# Patient Record
Sex: Female | Born: 1980 | Race: White | Hispanic: Yes | Marital: Single | Smoking: Never smoker
Health system: Southern US, Community
[De-identification: ages and names within clinical notes are randomized; demographics above are authoritative.]

## PROBLEM LIST (undated history)

## (undated) HISTORY — PX: CHOLECYSTECTOMY: SHX55

---

## 2019-12-22 ENCOUNTER — Emergency Department (HOSPITAL_COMMUNITY)
Admission: EM | Admit: 2019-12-22 | Discharge: 2019-12-22 | Disposition: A | Discharge status: Home / Self Care | Payer: Medicaid Other | Source: Home / Self Care | Attending: Emergency Medicine | Admitting: Emergency Medicine

## 2019-12-22 ENCOUNTER — Encounter (HOSPITAL_COMMUNITY): Payer: Self-pay | Admitting: Emergency Medicine

## 2019-12-22 ENCOUNTER — Emergency Department (HOSPITAL_COMMUNITY): Payer: Medicaid Other

## 2019-12-22 ENCOUNTER — Other Ambulatory Visit: Payer: Self-pay

## 2019-12-22 DIAGNOSIS — R1084 Generalized abdominal pain: Secondary | ICD-10-CM | POA: Insufficient documentation

## 2019-12-22 DIAGNOSIS — Z20828 Contact with and (suspected) exposure to other viral communicable diseases: Secondary | ICD-10-CM | POA: Insufficient documentation

## 2019-12-22 DIAGNOSIS — R110 Nausea: Secondary | ICD-10-CM

## 2019-12-22 LAB — COMPREHENSIVE METABOLIC PANEL
ALT: 14 U/L (ref 0–44)
AST: 18 U/L (ref 15–41)
Albumin: 4.1 g/dL (ref 3.5–5.0)
Alkaline Phosphatase: 69 U/L (ref 38–126)
Anion gap: 10 (ref 5–15)
BUN: 7 mg/dL (ref 6–20)
CO2: 22 mmol/L (ref 22–32)
Calcium: 9.3 mg/dL (ref 8.9–10.3)
Chloride: 105 mmol/L (ref 98–111)
Creatinine, Ser: 0.7 mg/dL (ref 0.44–1.00)
GFR calc Af Amer: >60 mL/min (ref >60)
GFR calc non Af Amer: >60 mL/min (ref >60)
Glucose, Bld: 131 mg/dL — ABNORMAL HIGH (ref 70–99)
Potassium: 3.7 mmol/L (ref 3.5–5.1)
Sodium: 137 mmol/L (ref 135–145)
Total Bilirubin: 1.1 mg/dL (ref 0.3–1.2)
Total Protein: 7.4 g/dL (ref 6.5–8.1)

## 2019-12-22 LAB — URINALYSIS, ROUTINE W REFLEX MICROSCOPIC
Bilirubin Urine: NEGATIVE
Glucose, UA: NEGATIVE mg/dL
Hgb urine dipstick: NEGATIVE
Ketones, ur: 20 mg/dL — AB
Nitrite: NEGATIVE
Protein, ur: 100 mg/dL — AB
Specific Gravity, Urine: 1.021 (ref 1.005–1.030)
pH: 8 (ref 5.0–8.0)

## 2019-12-22 LAB — CBC
HCT: 36.3 % (ref 36.0–46.0)
Hemoglobin: 11.7 g/dL — ABNORMAL LOW (ref 12.0–15.0)
MCH: 25.3 pg — ABNORMAL LOW (ref 26.0–34.0)
MCHC: 32.2 g/dL (ref 30.0–36.0)
MCV: 78.4 fL — ABNORMAL LOW (ref 80.0–100.0)
Platelets: 274 10*3/uL (ref 150–400)
RBC: 4.63 MIL/uL (ref 3.87–5.11)
RDW: 15.9 % — ABNORMAL HIGH (ref 11.5–15.5)
WBC: 6.7 10*3/uL (ref 4.0–10.5)
nRBC: 0 % (ref 0.0–0.2)

## 2019-12-22 LAB — WET PREP, GENITAL
Clue Cells Wet Prep HPF POC: NONE SEEN
Sperm: NONE SEEN
Trich, Wet Prep: NONE SEEN
Yeast Wet Prep HPF POC: NONE SEEN

## 2019-12-22 LAB — LIPASE, BLOOD: Lipase: 23 U/L (ref 11–51)

## 2019-12-22 LAB — I-STAT BETA HCG BLOOD, ED (MC, WL, AP ONLY): I-stat hCG, quantitative: <5 m[IU]/mL (ref <5)

## 2019-12-22 MED ORDER — KETOROLAC TROMETHAMINE 30 MG/ML IJ SOLN
30.0000 mg | Freq: Once | INTRAMUSCULAR | Status: AC
  Administered 2019-12-22: 09:00:00 30 mg via INTRAVENOUS
  Filled 2019-12-22: qty 1

## 2019-12-22 MED ORDER — SODIUM CHLORIDE 0.9 % IV BOLUS
1000.0000 mL | Freq: Once | INTRAVENOUS | Status: AC
  Administered 2019-12-22: 08:00:00 1000 mL via INTRAVENOUS

## 2019-12-22 MED ORDER — SODIUM CHLORIDE 0.9% FLUSH: 3.0000 mL | Freq: Once | INTRAVENOUS | Status: DC

## 2019-12-22 MED ORDER — MORPHINE SULFATE (PF) 4 MG/ML IV SOLN
4.0000 mg | Freq: Once | INTRAVENOUS | Status: AC
  Administered 2019-12-22: 4 mg via INTRAVENOUS
  Filled 2019-12-22: qty 1

## 2019-12-22 MED ORDER — ONDANSETRON HCL 4 MG/2ML IJ SOLN
4.0000 mg | Freq: Once | INTRAMUSCULAR | Status: AC
  Administered 2019-12-22: 4 mg via INTRAVENOUS
  Filled 2019-12-22: qty 2

## 2019-12-22 MED ORDER — SODIUM CHLORIDE 0.9 % IV BOLUS
1000.0000 mL | Freq: Once | INTRAVENOUS | Status: AC
  Administered 2019-12-22: 1000 mL via INTRAVENOUS

## 2019-12-22 MED ORDER — IOHEXOL 300 MG/ML  SOLN
100.0000 mL | Freq: Once | INTRAMUSCULAR | Status: AC | PRN
  Administered 2019-12-22: 09:00:00 100 mL via INTRAVENOUS

## 2019-12-22 MED ORDER — HYDROMORPHONE HCL 1 MG/ML IJ SOLN
0.5000 mg | Freq: Once | INTRAMUSCULAR | Status: AC
  Administered 2019-12-22: 13:00:00 0.5 mg via INTRAVENOUS
  Filled 2019-12-22: qty 1

## 2019-12-22 MED ORDER — HYDROMORPHONE HCL 1 MG/ML IJ SOLN
0.5000 mg | Freq: Once | INTRAMUSCULAR | Status: AC
  Administered 2019-12-22: 0.5 mg via INTRAVENOUS
  Filled 2019-12-22: qty 1

## 2019-12-22 MED ORDER — ONDANSETRON 4 MG PO TBDP: 4.0000 mg | ORAL_TABLET | Freq: Three times a day (TID) | ORAL | 0 refills | Status: DC | PRN

## 2019-12-22 MED ORDER — HYDROCODONE-ACETAMINOPHEN 5-325 MG PO TABS: 1.0000 | ORAL_TABLET | Freq: Four times a day (QID) | ORAL | 0 refills | Status: DC | PRN

## 2019-12-22 NOTE — ED Notes (Signed)
Using interpreter pt reports pain remains 10/10 after Morphine.  PA contacted.

## 2019-12-22 NOTE — ED Notes (Signed)
continues to have elevated pain responses.  PA in with pt to review CTT results and possible admission.  Pt understands with no further questions per interpreter.

## 2019-12-22 NOTE — Consult Note (Signed)
Reason for Consult: enteritis vs SBO Referring Physician: Elson Clan, [place]  Linda Vang is an 38 y.o. female.   HPI: 38yo Spanish speaking female presents with abdominal pain, n/v since 2300 on 12/24. History and physical exam performed with the assistance of an interpreter with the patient's nurse present in the room. She localizes her abdominal pain centrally and in her left back. She denies any prior symptoms. She last ate 12/24 AM and her last BM was 12/23. She has continued to pass flatus since then. She denies any sick contacts.   History reviewed. No pertinent past medical history.  Past Surgical History:  Procedure Laterality Date  . CESAREAN SECTION    . CHOLECYSTECTOMY      No family history on file.  Social History:  reports that she has never smoked. She has never used smokeless tobacco. She reports that she does not drink alcohol or use drugs.  Allergies: No Known Allergies  Medications: I have reviewed the patient's current medications.  Results for orders placed or performed during the hospital encounter of 12/22/19 (from the past 48 hour(s))  Lipase, blood     Status: None   Collection Time: 12/22/19  5:01 AM  Result Value Ref Range   Lipase 23 11 - 51 U/L    Comment: Performed at [facility], [address] [place], [place] [address]  Comprehensive metabolic panel     Status: Abnormal   Collection Time: 12/22/19  5:01 AM  Result Value Ref Range   Sodium 137 135 - 145 mmol/L   Potassium 3.7 3.5 - 5.1 mmol/L   Chloride 105 98 - 111 mmol/L   CO2 22 22 - 32 mmol/L   Glucose, Bld 131 (H) 70 - 99 mg/dL   BUN 7 6 - 20 mg/dL   Creatinine, Ser 3.55 0.44 - 1.00 mg/dL   Calcium 9.3 8.9 - 73.2 mg/dL   Total Protein 7.4 6.5 - 8.1 g/dL   Albumin 4.1 3.5 - 5.0 g/dL   AST 18 15 - 41 U/L   ALT 14 0 - 44 U/L   Alkaline Phosphatase 69 38 - 126 U/L   Total Bilirubin 1.1 0.3 - 1.2 mg/dL   GFR calc non Af Amer >60 >60 mL/min   GFR calc Af Amer >60 >60  mL/min   Anion gap 10 5 - 15    Comment: Performed at [facility], [address] [place], [place] [address]  CBC     Status: Abnormal   Collection Time: 12/22/19  5:01 AM  Result Value Ref Range   WBC 6.7 4.0 - 10.5 K/uL   RBC 4.63 3.87 - 5.11 MIL/uL   Hemoglobin 11.7 (L) 12.0 - 15.0 g/dL   HCT 27.0 62.3 - 76.2 %   MCV 78.4 (L) 80.0 - 100.0 fL   MCH 25.3 (L) 26.0 - 34.0 pg   MCHC 32.2 30.0 - 36.0 g/dL   RDW 83.1 (H) 51.7 - 61.6 %   Platelets 274 150 - 400 K/uL   nRBC 0.0 0.0 - 0.2 %    Comment: Performed at [facility], [address], [place], [place] [address]  I-Stat beta hCG blood, ED     Status: None   Collection Time: 12/22/19  5:09 AM  Result Value Ref Range   I-stat hCG, quantitative <5.0 <5 mIU/mL   Comment 3            Comment:   GEST. AGE  CONC.  (mIU/mL)   <=1 WEEK        5 - 50     2 WEEKS       50 - 500     3 WEEKS       100 - 10,000     4 WEEKS     1,000 - 30,000        FEMALE AND NON-PREGNANT FEMALE:     LESS THAN 5 mIU/mL   Urinalysis, Routine w reflex microscopic     Status: Abnormal   Collection Time: 12/22/19  7:16 AM  Result Value Ref Range   Color, Urine YELLOW YELLOW   APPearance HAZY (A) CLEAR   Specific Gravity, Urine 1.021 1.005 - 1.030   pH 8.0 5.0 - 8.0   Glucose, UA NEGATIVE NEGATIVE mg/dL   Hgb urine dipstick NEGATIVE NEGATIVE   Bilirubin Urine NEGATIVE NEGATIVE   Ketones, ur 20 (A) NEGATIVE mg/dL   Protein, ur 161100 (A) NEGATIVE mg/dL   Nitrite NEGATIVE NEGATIVE   Leukocytes,Ua TRACE (A) NEGATIVE   RBC / HPF 0-5 0 - 5 RBC/hpf   WBC, UA 0-5 0 - 5 WBC/hpf   Bacteria, UA FEW (A) NONE SEEN   Squamous Epithelial / LPF 11-20 0 - 5   Mucus PRESENT     Comment: Performed at [facility], [address] [place], [place] [address]  Wet prep, genital     Status: Abnormal   Collection Time: 12/22/19  9:35 AM   Specimen: PATH Cytology Cervicovaginal Ancillary Only  Result Value Ref Range   Yeast Wet Prep HPF POC NONE SEEN  NONE SEEN   Trich, Wet Prep NONE SEEN NONE SEEN   Clue Cells Wet Prep HPF POC NONE SEEN NONE SEEN   WBC, Wet Prep HPF POC MANY (A) NONE SEEN   Sperm NONE SEEN     Comment: Performed at [facility], [address] [place], [place] [address]    CT ABDOMEN PELVIS W CONTRAST  Result Date: 12/22/2019 CLINICAL DATA:  Diffuse lower abdominal pain radiating to left side beginning last night. Nausea and vomiting. EXAM: CT ABDOMEN AND PELVIS WITH CONTRAST TECHNIQUE: Multidetector CT imaging of the abdomen and pelvis was performed using the standard protocol following bolus administration of intravenous contrast. CONTRAST:  100mL OMNIPAQUE IOHEXOL 300 MG/ML  SOLN COMPARISON:  None. FINDINGS: Lower chest: Lung bases are normal Hepatobiliary: Previous cholecystectomy. Liver and biliary tree are normal. Pancreas: Normal. Spleen: Normal. Adrenals/Urinary Tract: Adrenal glands are normal. Kidneys are normal in size without hydronephrosis. Bladder is normal. Stomach/Bowel: Stomach is normal. There are a few patchy areas of air and fluid-filled mildly dilated small bowel in the mid to lower abdomen with intervening normal caliber small bowel. There is an adjacent nondilated small bowel loop in the right lower quadrant with wall thickening and mild stranding of the adjacent mesenteric fat. Appendix is normal.  Colon is unremarkable. Vascular/Lymphatic: Abdominal aorta is normal in caliber. No evidence of adenopathy. Reproductive: Within normal. Other: No free peritoneal air. Musculoskeletal: No acute findings. IMPRESSION: A few patchy areas of air and fluid-filled dilated small bowel loops in the mid to lower abdomen in addition to an adjacent thick-walled small bowel loop with stranding of the adjacent mesenteric fat. Findings may be due to a regional enteritis of infectious or inflammatory nature, although patchy areas of ileus or partial obstruction or possible. Electronically Signed   By: Elberta Fortisaniel  Boyle M.D.    On: 12/22/2019 09:41  ROS 10 point review of systems is negative except as listed above in HPI.   Physical Exam Blood pressure 115/76, pulse 73, temperature 98.7 F (37.1 C), temperature source Oral, resp. rate 16, last menstrual period 11/03/2019, SpO2 100 %. Gen: comfortable, no distress Neuro: non-focal exam HEENT: PERRL, MMM Neck: supple CV: RRR Pulm: unlabored breathing Abd: soft, nondistended, NT, even to deep palpation GU: spont voids Extr: wwp, no edema   Assessment/Plan: 63F with abd pain, n/v of 12h duration and abnormal CT imaging.  Patient has normal vitals, normal WBC, and normal electrolytes with a benign abdominal exam. She does not appear to be toxic or dehydrated in any way. Given her surgical history, early pSBO is certainly on the differential, however at this point, I do not think NGT decompression is warranted. After reviewing her CT scan, an infectious/inflammatory etiology of her symptoms is also possible, but if this is indeed the cause, due to her clinical appearance, I would recommend conservative management with bland diet and anti-emetics. I offered the patient the option to be admitted for 23h obs with PO challenge, pain control, and anti-emetics, which she declined in favor of managing her symptoms at home, which I think is quite a reasonable plan. Prior to discharge, she will undergo a PO challenge and a trial of an enteral pain management regimen and if this is successful, she may be discharged home. She states she lives with her mother and three children and can return if her symptoms worsen. She was specifically counseled to return to the ED for worsening abdominal pain or persistent n/v preventing her from maintaining oral intake. She verbalized understanding.    Jesusita Oka, MD General and [facility]

## 2019-12-22 NOTE — ED Notes (Signed)
Remains restless in bed, additional meds given.  PA in with pt as well for update on POC and review of results.

## 2019-12-22 NOTE — ED Provider Notes (Signed)
[facility] EMERGENCY DEPARTMENT Provider Note   CSN: [id number] Arrival date & time: 12/22/19  0434     History Chief Complaint  Patient presents with  . Abdominal Pain    Linda Vang is a 38 y.o. female who presents for evaluation of left sided abdominal pain that began last night. She states that she had some mild abdominal discomfort throughout the day but stated that at approximately 11pm last night, the pain acutely worsened. She states it is in her left side and wraps around to her left flank. She reports associated vomiting. Emesis is non-bloody, non-billious. She has not taken any medication for the pain. Her LMP was 11/03/19. She denies any fevers, CP, SOB, dysuria, hematuria, diarrhea, vaginal bleeding, vaginal discharge.   The history is provided by the patient.       History reviewed. No pertinent past medical history.  There are no problems to display for this patient.   Past Surgical History:  Procedure Laterality Date  . CESAREAN SECTION    . CHOLECYSTECTOMY       OB History   No obstetric history on file.     No family history on file.  Social History   Tobacco Use  . Smoking status: Never Smoker  . Smokeless tobacco: Never Used  Substance Use Topics  . Alcohol use: Never  . Drug use: Never    Home Medications Prior to Admission medications   Medication Sig Start Date End Date Taking? Authorizing Provider  HYDROcodone-acetaminophen (NORCO/VICODIN) 5-325 MG tablet Take 1-2 tablets by mouth every 6 (six) hours as needed. 12/22/19   Maxwell Caul, PA-C  ondansetron (ZOFRAN ODT) 4 MG disintegrating tablet Take 1 tablet (4 mg total) by mouth every 8 (eight) hours as needed for nausea or vomiting. 12/22/19   Maxwell Caul, PA-C    Allergies    Patient has no known allergies.  Review of Systems   Review of Systems  Constitutional: Negative for fever.  Respiratory: Negative for cough and shortness of breath.    Cardiovascular: Negative for chest pain.  Gastrointestinal: Positive for abdominal pain, nausea and vomiting.  Genitourinary: Negative for dysuria, hematuria, vaginal discharge and vaginal pain.  Neurological: Negative for headaches.  All other systems reviewed and are negative.   Physical Exam Updated Vital Signs BP 115/76   Pulse 73   Temp 98.7 F (37.1 C) (Oral)   Resp 16   LMP 11/03/2019   SpO2 100%   Physical Exam Vitals and nursing note reviewed. Exam conducted with a chaperone present.  Constitutional:      Appearance: Normal appearance. She is well-developed.     Comments: Sitting comfortably on examination table  HENT:     Head: Normocephalic and atraumatic.  Eyes:     General: Lids are normal.     Conjunctiva/sclera: Conjunctivae normal.     Pupils: Pupils are equal, round, and reactive to light.  Cardiovascular:     Rate and Rhythm: Normal rate and regular rhythm.     Pulses: Normal pulses.     Heart sounds: Normal heart sounds. No murmur. No friction rub. No gallop.   Pulmonary:     Effort: Pulmonary effort is normal.     Breath sounds: Normal breath sounds.     Comments: Lungs clear to auscultation bilaterally.  Symmetric chest rise.  No wheezing, rales, rhonchi. Abdominal:     Palpations: Abdomen is soft. Abdomen is not rigid.     Tenderness: There is abdominal  tenderness in the left upper quadrant and left lower quadrant. There is left CVA tenderness. There is no guarding.     Comments: Abdomen is soft, non-distended. Tenderness noted to the left abdomen. No rigidity, no guarding. Left sided CVA tenderness noted.   Genitourinary:    Cervix: No cervical motion tenderness.     Adnexa:        Right: No mass or tenderness.         Left: No mass or tenderness.       Comments: The exam was performed with a chaperone present. Normal external female genitalia. No lesions, rash, or sores.  No CMT.  No adnexal mass or tenderness noted bilaterally. Musculoskeletal:         General: Normal range of motion.     Cervical back: Full passive range of motion without pain.  Skin:    General: Skin is warm and dry.     Capillary Refill: Capillary refill takes less than 2 seconds.  Neurological:     Mental Status: She is alert and oriented to person, place, and time.  Psychiatric:        Speech: Speech normal.     ED Results / Procedures / Treatments   Labs (all labs ordered are listed, but only abnormal results are displayed) Labs Reviewed  WET PREP, GENITAL - Abnormal; Notable for the following components:      Result Value   WBC, Wet Prep HPF POC MANY (*)    All other components within normal limits  COMPREHENSIVE METABOLIC PANEL - Abnormal; Notable for the following components:   Glucose, Bld 131 (*)    All other components within normal limits  CBC - Abnormal; Notable for the following components:   Hemoglobin 11.7 (*)    MCV 78.4 (*)    MCH 25.3 (*)    RDW 15.9 (*)    All other components within normal limits  URINALYSIS, ROUTINE W REFLEX MICROSCOPIC - Abnormal; Notable for the following components:   APPearance HAZY (*)    Ketones, ur 20 (*)    Protein, ur 100 (*)    Leukocytes,Ua TRACE (*)    Bacteria, UA FEW (*)    All other components within normal limits  SARS CORONAVIRUS 2 (TAT 6-24 HRS)  LIPASE, BLOOD  I-STAT BETA HCG BLOOD, ED ([facility], [facility], [facility] ONLY)  GC/CHLAMYDIA PROBE AMP ([place]) NOT AT [facility]    EKG None  Radiology CT ABDOMEN PELVIS W CONTRAST  Result Date: 12/22/2019 CLINICAL DATA:  Diffuse lower abdominal pain radiating to left side beginning last night. Nausea and vomiting. EXAM: CT ABDOMEN AND PELVIS WITH CONTRAST TECHNIQUE: Multidetector CT imaging of the abdomen and pelvis was performed using the standard protocol following bolus administration of intravenous contrast. CONTRAST:  OMNIPAQUE IOHEXOL 300 MG/ML  SOLN COMPARISON:  None. FINDINGS: Lower chest: Lung bases are normal Hepatobiliary: Previous  cholecystectomy. Liver and biliary tree are normal. Pancreas: Normal. Spleen: Normal. Adrenals/Urinary Tract: Adrenal glands are normal. Kidneys are normal in size without hydronephrosis. Bladder is normal. Stomach/Bowel: Stomach is normal. There are a few patchy areas of air and fluid-filled mildly dilated small bowel in the mid to lower abdomen with intervening normal caliber small bowel. There is an adjacent nondilated small bowel loop in the right lower quadrant with wall thickening and mild stranding of the adjacent mesenteric fat. Appendix is normal.  Colon is unremarkable. Vascular/Lymphatic: Abdominal aorta is normal in caliber. No evidence of adenopathy. Reproductive: Within normal. Other: No free  peritoneal air. Musculoskeletal: No acute findings. IMPRESSION: A few patchy areas of air and fluid-filled dilated small bowel loops in the mid to lower abdomen in addition to an adjacent thick-walled small bowel loop with stranding of the adjacent mesenteric fat. Findings may be due to a regional enteritis of infectious or inflammatory nature, although patchy areas of ileus or partial obstruction or possible. Electronically Signed   By: Marin Olp M.D.   On: 12/22/2019 09:41    Procedures Procedures (including critical care time)  Medications Ordered in ED Medications  sodium chloride flush (NS) 0.9 % injection 3 mL (3 mLs Intravenous Not Given 12/22/19 0745)  sodium chloride 0.9 % bolus 1,000 mL (0 mLs Intravenous Stopped 12/22/19 0835)  ondansetron (ZOFRAN) injection 4 mg (4 mg Intravenous Given 12/22/19 0744)  morphine 4 MG/ML injection 4 mg (4 mg Intravenous Given 12/22/19 0829)  ketorolac (TORADOL) 30 MG/ML injection 30 mg (30 mg Intravenous Given 12/22/19 0845)  iohexol (OMNIPAQUE) 300 MG/ML solution 100 mL (100 mLs Intravenous Contrast Given 12/22/19 0908)  HYDROmorphone (DILAUDID) injection 0.5 mg (0.5 mg Intravenous Given 12/22/19 1010)  sodium chloride 0.9 % bolus 1,000 mL (1,000 mLs  Intravenous New Bag/Given 12/22/19 1010)  HYDROmorphone (DILAUDID) injection 0.5 mg (0.5 mg Intravenous Given 12/22/19 1248)    ED Course  I have reviewed the triage vital signs and the nursing notes.  Pertinent labs & imaging results that were available during my care of the patient were reviewed by me and considered in my medical decision making (see chart for details).    MDM Rules/Calculators/A&P                      38 year old female who presents for evaluation of left-sided abdominal pain that began yesterday.  Reports associated vomiting.  No fevers, diarrhea. Patient is afebrile, non-toxic appearing, sitting comfortably on examination table. Vital signs reviewed and stable.  On exam, she does have some left-sided CVA tenderness as well as some left abdominal tenderness.  Consider infectious etiology versus GU etiology.  Plan for labs, urine.  I-stat beta is negative. CBC shows no leukocytosis. Hgb is 11.7. CMP shows normal BUN and Cr.  UA shows trace leukocytes, bacteria but there is squamous epithelium present so question contaminant.  Will plan for CT for further evaluation of her pain.  Patient still very uncomfortable after initial round of pain medication.  Will plan for additional analgesics.  Pelvic exam as document above.  No CMT that would be concerning for PID.  No adnexal mass or tenderness noted.  Exam not concerning for ovarian torsion.  Plan to check CT scan.  CT scan shows a few patchy areas of air and fluid-filled dilated small bowel loops in the mid to lower abdomen in addition to an adjacent thick-walled small bowel loop with stranding to the adjacent mesenteric fat.  This may represent enteritis.  Could be ileus or partial obstruction.  Discussed results with patient.  She states she has not had a bowel movement since about Wednesday.  She does think that she is still been passing gas.  She is still very uncomfortable.  Discussed patient with Jerene Pitch (Gen Surg PA).  She will evaluate patient in the ED.   Discussed patient with Dr. Bobbye Morton (Gen Surg) after evaluation with patient. Patient is much more comfortable after pain medications. Given overall reassuring appearance this may be more reflective of infectious etiology rather than obstruction. Patient encouraged on bowel rest. Dr. Bobbye Morton discussed admission vs discharge  home with patient. Patient would like to go home. Plan to PO challenge in the department and reassess.   After p.o. challenge, patient had some returning of pain.  Patient did not have any vomiting.  Initially, patient had requested admission for pain control.  About 20 minutes later, patient wanted to talk to me.  Using interpreter, discussed with her.  She reports feeling better.  She looks better and is resting comfortably in the bed.  She states that she changed her mind and does not wish to be admitted she wishes to go home.  Reevaluation of her abdomen shows improvement in pain.  She is hemodynamically stable.  At this time, I feel that this is reasonable.  We will send her on a short course of pain medication as well as nausea medication. At this time, patient exhibits no emergent life-threatening condition that require further evaluation in ED or admission. Patient had ample opportunity for questions and discussion. All patient's questions were answered with full understanding. Strict return precautions discussed. Patient expresses understanding and agreement to plan.   Portions of this note were generated with Scientist, clinical (histocompatibility and immunogenetics)Dragon dictation software. Dictation errors may occur despite best attempts at proofreading.   Final Clinical Impression(s) / ED Diagnoses Final diagnoses:  Generalized abdominal pain  Nausea    Rx / DC Orders ED Discharge Orders         Ordered    HYDROcodone-acetaminophen (NORCO/VICODIN) 5-325 MG tablet  Every 6 hours PRN     12/22/19 1239    ondansetron (ZOFRAN ODT) 4 MG disintegrating tablet  Every 8 hours PRN      12/22/19 1239           Rosana HoesLayden, Duval Macleod A, PA-C 12/22/19 1251    Loren RacerYelverton, David, MD 12/22/19 1357

## 2019-12-22 NOTE — ED Notes (Signed)
Pt has decided to go home.  DC instructions reviewed with interpreter.  No questions at this time.

## 2019-12-22 NOTE — ED Notes (Signed)
Surgeon in with patient and will do PO fluid challenge and send pt home.  After challenge pt states her pain increased to a 5 and requests to stay in the hospital for pain control.  PA notified.

## 2019-12-22 NOTE — ED Triage Notes (Signed)
Patient reports mid abdominal pain radiating to left flank onset last night with emesis , denies fever or diarrhea . Spanish interpreter services utilized during triage .

## 2019-12-22 NOTE — Discharge Instructions (Signed)
Take pain medications as directed for break through pain. Do not drive or operate machinery while taking this medication.   Take zofran as needed for nausea/vomiting.   Closely monitor your symptoms.  Return emergency department for any worsening abdominal pain, vomiting, inability eat or drink, fevers, chest pain or any other worsening or concerning symptoms.

## 2019-12-23 ENCOUNTER — Inpatient Hospital Stay (HOSPITAL_COMMUNITY): Payer: Medicaid Other

## 2019-12-23 ENCOUNTER — Inpatient Hospital Stay (HOSPITAL_COMMUNITY)
Admission: EM | Admit: 2019-12-23 | Discharge: 2020-01-03 | DRG: 331 | Disposition: A | Discharge status: Home / Self Care | Payer: Medicaid Other | Attending: Surgery | Admitting: Surgery

## 2019-12-23 ENCOUNTER — Other Ambulatory Visit: Payer: Self-pay

## 2019-12-23 ENCOUNTER — Emergency Department (HOSPITAL_COMMUNITY): Payer: Medicaid Other

## 2019-12-23 ENCOUNTER — Encounter (HOSPITAL_COMMUNITY): Payer: Self-pay | Admitting: Emergency Medicine

## 2019-12-23 DIAGNOSIS — E876 Hypokalemia: Secondary | ICD-10-CM | POA: Diagnosis not present

## 2019-12-23 DIAGNOSIS — K56600 Partial intestinal obstruction, unspecified as to cause: Secondary | ICD-10-CM

## 2019-12-23 DIAGNOSIS — Z20822 Contact with and (suspected) exposure to covid-19: Secondary | ICD-10-CM | POA: Diagnosis present

## 2019-12-23 DIAGNOSIS — K56609 Unspecified intestinal obstruction, unspecified as to partial versus complete obstruction: Secondary | ICD-10-CM | POA: Diagnosis present

## 2019-12-23 DIAGNOSIS — R112 Nausea with vomiting, unspecified: Secondary | ICD-10-CM

## 2019-12-23 DIAGNOSIS — Z0189 Encounter for other specified special examinations: Secondary | ICD-10-CM

## 2019-12-23 DIAGNOSIS — K5651 Intestinal adhesions [bands], with partial obstruction: Principal | ICD-10-CM | POA: Diagnosis present

## 2019-12-23 LAB — CBC WITH DIFFERENTIAL/PLATELET
Abs Immature Granulocytes: 0.03 10*3/uL (ref 0.00–0.07)
Basophils Absolute: 0 10*3/uL (ref 0.0–0.1)
Basophils Relative: 0 %
Eosinophils Absolute: 0 10*3/uL (ref 0.0–0.5)
Eosinophils Relative: 0 %
HCT: 38.6 % (ref 36.0–46.0)
Hemoglobin: 12.2 g/dL (ref 12.0–15.0)
Immature Granulocytes: 0 %
Lymphocytes Relative: 9 %
Lymphs Abs: 0.8 10*3/uL (ref 0.7–4.0)
MCH: 25.1 pg — ABNORMAL LOW (ref 26.0–34.0)
MCHC: 31.6 g/dL (ref 30.0–36.0)
MCV: 79.3 fL — ABNORMAL LOW (ref 80.0–100.0)
Monocytes Absolute: 0.4 10*3/uL (ref 0.1–1.0)
Monocytes Relative: 4 %
Neutro Abs: 8.4 10*3/uL — ABNORMAL HIGH (ref 1.7–7.7)
Neutrophils Relative %: 87 %
Platelets: 263 10*3/uL (ref 150–400)
RBC: 4.87 MIL/uL (ref 3.87–5.11)
RDW: 16.5 % — ABNORMAL HIGH (ref 11.5–15.5)
WBC: 9.7 10*3/uL (ref 4.0–10.5)
nRBC: 0 % (ref 0.0–0.2)

## 2019-12-23 LAB — COMPREHENSIVE METABOLIC PANEL
ALT: 14 U/L (ref 0–44)
AST: 18 U/L (ref 15–41)
Albumin: 4 g/dL (ref 3.5–5.0)
Alkaline Phosphatase: 67 U/L (ref 38–126)
Anion gap: 10 (ref 5–15)
BUN: 8 mg/dL (ref 6–20)
CO2: 22 mmol/L (ref 22–32)
Calcium: 9.2 mg/dL (ref 8.9–10.3)
Chloride: 106 mmol/L (ref 98–111)
Creatinine, Ser: 0.68 mg/dL (ref 0.44–1.00)
GFR calc Af Amer: >60 mL/min (ref >60)
GFR calc non Af Amer: >60 mL/min (ref >60)
Glucose, Bld: 140 mg/dL — ABNORMAL HIGH (ref 70–99)
Potassium: 3.3 mmol/L — ABNORMAL LOW (ref 3.5–5.1)
Sodium: 138 mmol/L (ref 135–145)
Total Bilirubin: 1.2 mg/dL (ref 0.3–1.2)
Total Protein: 7.5 g/dL (ref 6.5–8.1)

## 2019-12-23 LAB — LIPASE, BLOOD: Lipase: 24 U/L (ref 11–51)

## 2019-12-23 MED ORDER — HYDROMORPHONE HCL 1 MG/ML IJ SOLN
0.5000 mg | INTRAMUSCULAR | Status: DC | PRN
  Administered 2019-12-24 – 2019-12-28 (×16): 0.5 mg via INTRAVENOUS
  Filled 2019-12-23 (×17): qty 1

## 2019-12-23 MED ORDER — HEPARIN SODIUM (PORCINE) 5000 UNIT/ML IJ SOLN
5000.0000 [IU] | Freq: Three times a day (TID) | INTRAMUSCULAR | Status: DC
  Administered 2019-12-24 (×3): 5000 [IU] via SUBCUTANEOUS
  Filled 2019-12-23 (×3): qty 1

## 2019-12-23 MED ORDER — ONDANSETRON HCL 4 MG/2ML IJ SOLN
4.0000 mg | Freq: Four times a day (QID) | INTRAMUSCULAR | Status: DC | PRN
  Administered 2019-12-25 – 2019-12-30 (×3): 4 mg via INTRAVENOUS
  Filled 2019-12-23 (×4): qty 2

## 2019-12-23 MED ORDER — LACTATED RINGERS IV SOLN: INTRAVENOUS | Status: DC

## 2019-12-23 MED ORDER — DIATRIZOATE MEGLUMINE & SODIUM 66-10 % PO SOLN
90.0000 mL | Freq: Once | ORAL | Status: AC
  Administered 2019-12-24: 04:00:00 90 mL via NASOGASTRIC
  Filled 2019-12-23: qty 90

## 2019-12-23 MED ORDER — METHOCARBAMOL 500 MG PO TABS
500.0000 mg | ORAL_TABLET | Freq: Four times a day (QID) | ORAL | Status: DC | PRN
  Administered 2019-12-25 – 2020-01-01 (×4): 500 mg via ORAL
  Filled 2019-12-23 (×4): qty 1

## 2019-12-23 MED ORDER — DIPHENHYDRAMINE HCL 50 MG/ML IJ SOLN: 12.5000 mg | Freq: Four times a day (QID) | INTRAMUSCULAR | Status: DC | PRN

## 2019-12-23 MED ORDER — SIMETHICONE 80 MG PO CHEW
40.0000 mg | CHEWABLE_TABLET | Freq: Four times a day (QID) | ORAL | Status: DC | PRN
  Administered 2019-12-27 – 2020-01-02 (×4): 40 mg via ORAL
  Filled 2019-12-23 (×4): qty 1

## 2019-12-23 MED ORDER — DIPHENHYDRAMINE HCL 12.5 MG/5ML PO ELIX: 12.5000 mg | ORAL_SOLUTION | Freq: Four times a day (QID) | ORAL | Status: DC | PRN

## 2019-12-23 MED ORDER — ONDANSETRON HCL 4 MG/2ML IJ SOLN
4.0000 mg | Freq: Once | INTRAMUSCULAR | Status: AC
  Administered 2019-12-23: 4 mg via INTRAVENOUS
  Filled 2019-12-23: qty 2

## 2019-12-23 MED ORDER — IOHEXOL 300 MG/ML  SOLN
100.0000 mL | Freq: Once | INTRAMUSCULAR | Status: AC | PRN
  Administered 2019-12-23: 19:00:00 100 mL via INTRAVENOUS

## 2019-12-23 MED ORDER — ACETAMINOPHEN 500 MG PO TABS
1000.0000 mg | ORAL_TABLET | Freq: Four times a day (QID) | ORAL | Status: DC
  Administered 2019-12-24 – 2020-01-03 (×34): 1000 mg via ORAL
  Filled 2019-12-23 (×36): qty 2

## 2019-12-23 MED ORDER — MORPHINE SULFATE (PF) 4 MG/ML IV SOLN
4.0000 mg | Freq: Once | INTRAVENOUS | Status: AC
  Administered 2019-12-23: 4 mg via INTRAVENOUS
  Filled 2019-12-23: qty 1

## 2019-12-23 MED ORDER — ONDANSETRON 4 MG PO TBDP
4.0000 mg | ORAL_TABLET | Freq: Four times a day (QID) | ORAL | Status: DC | PRN
  Administered 2019-12-27 (×2): 4 mg via ORAL
  Filled 2019-12-23 (×2): qty 1

## 2019-12-23 NOTE — ED Notes (Signed)
RN attempted report 

## 2019-12-23 NOTE — ED Notes (Signed)
Patient daughter Belenda Cruise calling stating she is very worried would like an update on patient Please call   [phone]

## 2019-12-23 NOTE — ED Notes (Signed)
Patient daughter Katherine calling stating she is very worried would like an update on patient Please call   [phone]  

## 2019-12-23 NOTE — ED Notes (Signed)
[phone] caterin cordero call for an update

## 2019-12-23 NOTE — H&P (Signed)
CC: pSBO  Requesting provider: Dietrich Pates PA-C  HPI: Linda Vang is an 38 y.o. female with 2d hx of progressive abdominal cramps - she was here yesterday for this and seen by our service. She was discharged after passing a PO challenge but hasn't felt well since arriving home. She has intermittent nausea since going home and intermittent cramps. Denies fever/chills. Some flatus; last BM was 12/23.   History reviewed. No pertinent past medical history.  Past Surgical History:  Procedure Laterality Date   CESAREAN SECTION     CHOLECYSTECTOMY      No family history on file.  Social:  reports that she has never smoked. She has never used smokeless tobacco. She reports that she does not drink alcohol or use drugs.  Allergies: No Known Allergies  Medications: I have reviewed the patient's current medications.  Results for orders placed or performed during the hospital encounter of 12/23/19 (from the past 48 hour(s))  Comprehensive metabolic panel     Status: Abnormal   Collection Time: 12/23/19  5:42 PM  Result Value Ref Range   Sodium 138 135 - 145 mmol/L   Potassium 3.3 (L) 3.5 - 5.1 mmol/L   Chloride 106 98 - 111 mmol/L   CO2 22 22 - 32 mmol/L   Glucose, Bld 140 (H) 70 - 99 mg/dL   BUN 8 6 - 20 mg/dL   Creatinine, Ser 1.61 0.44 - 1.00 mg/dL   Calcium 9.2 8.9 - 09.6 mg/dL   Total Protein 7.5 6.5 - 8.1 g/dL   Albumin 4.0 3.5 - 5.0 g/dL   AST 18 15 - 41 U/L   ALT 14 0 - 44 U/L   Alkaline Phosphatase 67 38 - 126 U/L   Total Bilirubin 1.2 0.3 - 1.2 mg/dL   GFR calc non Af Amer >60 >60 mL/min   GFR calc Af Amer >60 >60 mL/min   Anion gap 10 5 - 15    Comment: Performed at [facility], [address] [place], [place] [address]  CBC with Differential     Status: Abnormal   Collection Time: 12/23/19  5:42 PM  Result Value Ref Range   WBC 9.7 4.0 - 10.5 K/uL   RBC 4.87 3.87 - 5.11 MIL/uL   Hemoglobin 12.2 12.0 - 15.0 g/dL   HCT 98.1 19.1 - 47.8 %   MCV  79.3 (L) 80.0 - 100.0 fL   MCH 25.1 (L) 26.0 - 34.0 pg   MCHC 31.6 30.0 - 36.0 g/dL   RDW 29.5 (H) 62.1 - 30.8 %   Platelets 263 150 - 400 K/uL   nRBC 0.0 0.0 - 0.2 %   Neutrophils Relative % 87 %   Neutro Abs 8.4 (H) 1.7 - 7.7 K/uL   Lymphocytes Relative 9 %   Lymphs Abs 0.8 0.7 - 4.0 K/uL   Monocytes Relative 4 %   Monocytes Absolute 0.4 0.1 - 1.0 K/uL   Eosinophils Relative 0 %   Eosinophils Absolute 0.0 0.0 - 0.5 K/uL   Basophils Relative 0 %   Basophils Absolute 0.0 0.0 - 0.1 K/uL   Immature Granulocytes 0 %   Abs Immature Granulocytes 0.03 0.00 - 0.07 K/uL    Comment: Performed at [facility], [address], [place], [place] [address]  Lipase, blood     Status: None   Collection Time: 12/23/19  5:42 PM  Result Value Ref Range   Lipase 24 11 - 51 U/L  Comment: Performed at [facility], [address] [place], [place] [id number]    CT ABDOMEN PELVIS W CONTRAST  Result Date: 12/23/2019 CLINICAL DATA:  Abdominal pain and vomiting which has worsened since yesterday, question bowel obstruction EXAM: CT ABDOMEN AND PELVIS WITH CONTRAST TECHNIQUE: Multidetector CT imaging of the abdomen and pelvis was performed using the standard protocol following bolus administration of intravenous contrast. Sagittal and coronal MPR images reconstructed from axial data set. CONTRAST:  100mL OMNIPAQUE IOHEXOL 300 MG/ML SOLN IV. No oral contrast. COMPARISON:  12/22/2019 FINDINGS: Lower chest: Lung bases clear Hepatobiliary: Gallbladder surgically absent. Liver normal appearance. Pancreas: Normal appearance Spleen: Normal appearance Adrenals/Urinary Tract: Adrenal glands, kidneys, ureters, and bladder normal appearance Stomach/Bowel: Small amount of fluid within distal esophagus. Stomach unremarkable. Normal appendix. Colon decompressed. Dilated proximal and mid small bowel loops increased in diameter and number since prior study. Decompressed distal small bowel loops. Findings consistent  with progressive small bowel obstruction. Transition zone appears to be in the RIGHT upper pelvis image 61, question adhesion. Associated mesenteric edema. Thickened small bowel loop seen on the previous exam does not appear thickened on current study. Vascular/Lymphatic: Vascular structures patent.  No adenopathy. Reproductive: Unremarkable uterus and adnexa Other: Low-attenuation free fluid in pelvis. No free air. Umbilical hernia containing fat. Musculoskeletal: No acute osseous findings. IMPRESSION: Progressive dilatation of proximal to mid small bowel loops with persistent decompression of distal small bowel consistent with progressive small bowel obstruction. Transition zone is located in the anterior upper RIGHT pelvis, favor due to adhesion. Increased mesenteric edema of some of the small bowel loops and increased free pelvic fluid without evidence of perforation. Electronically Signed   By: Ulyses SouthwardMark  Boles M.D.   On: 12/23/2019 19:24   CT ABDOMEN PELVIS W CONTRAST  Result Date: 12/22/2019 CLINICAL DATA:  Diffuse lower abdominal pain radiating to left side beginning last night. Nausea and vomiting. EXAM: CT ABDOMEN AND PELVIS WITH CONTRAST TECHNIQUE: Multidetector CT imaging of the abdomen and pelvis was performed using the standard protocol following bolus administration of intravenous contrast. CONTRAST:  100mL OMNIPAQUE IOHEXOL 300 MG/ML  SOLN COMPARISON:  None. FINDINGS: Lower chest: Lung bases are normal Hepatobiliary: Previous cholecystectomy. Liver and biliary tree are normal. Pancreas: Normal. Spleen: Normal. Adrenals/Urinary Tract: Adrenal glands are normal. Kidneys are normal in size without hydronephrosis. Bladder is normal. Stomach/Bowel: Stomach is normal. There are a few patchy areas of air and fluid-filled mildly dilated small bowel in the mid to lower abdomen with intervening normal caliber small bowel. There is an adjacent nondilated small bowel loop in the right lower quadrant with wall  thickening and mild stranding of the adjacent mesenteric fat. Appendix is normal.  Colon is unremarkable. Vascular/Lymphatic: Abdominal aorta is normal in caliber. No evidence of adenopathy. Reproductive: Within normal. Other: No free peritoneal air. Musculoskeletal: No acute findings. IMPRESSION: A few patchy areas of air and fluid-filled dilated small bowel loops in the mid to lower abdomen in addition to an adjacent thick-walled small bowel loop with stranding of the adjacent mesenteric fat. Findings may be due to a regional enteritis of infectious or inflammatory nature, although patchy areas of ileus or partial obstruction or possible. Electronically Signed   By: Elberta Fortisaniel  Boyle M.D.   On: 12/22/2019 09:41    ROS - all of the below systems have been reviewed with the patient and positives are indicated with bold text General: chills, fever or night sweats Eyes: blurry vision or double vision ENT: epistaxis or sore throat Allergy/Immunology:  itchy/watery eyes or nasal congestion Hematologic/Lymphatic: bleeding problems, blood clots or swollen lymph nodes Endocrine: temperature intolerance or unexpected weight changes Breast: new or changing breast lumps or nipple discharge Resp: cough, shortness of breath, or wheezing CV: chest pain or dyspnea on exertion GI: as per HPI GU: dysuria, trouble voiding, or hematuria MSK: joint pain or joint stiffness Neuro: TIA or stroke symptoms Derm: pruritus and skin lesion changes Psych: anxiety and depression  PE Blood pressure 124/87, pulse 74, temperature 97.9 F (36.6 C), temperature source Oral, resp. rate 10, SpO2 98 %. Constitutional: NAD; conversant; no deformities Eyes: Moist conjunctiva; no lid lag; anicteric; pupils equal and round Neck: Trachea midline; no thyromegaly Lungs: Normal respiratory effort; no tactile fremitus CV: RRR; no palpable thrills; no pitting edema GI: Abd soft, nontender, nondistended; no rebound, no guarding; no palpable  hepatosplenomegaly MSK: Normal gait; no clubbing/cyanosis Psychiatric: Appropriate affect; alert and oriented x3 Lymphatic: No palpable cervical or axillary lymphadenopathy  Results for orders placed or performed during the hospital encounter of 12/23/19 (from the past 48 hour(s))  Comprehensive metabolic panel     Status: Abnormal   Collection Time: 12/23/19  5:42 PM  Result Value Ref Range   Sodium 138 135 - 145 mmol/L   Potassium 3.3 (L) 3.5 - 5.1 mmol/L   Chloride 106 98 - 111 mmol/L   CO2 22 22 - 32 mmol/L   Glucose, Bld 140 (H) 70 - 99 mg/dL   BUN 8 6 - 20 mg/dL   Creatinine, Ser 1.61 0.44 - 1.00 mg/dL   Calcium 9.2 8.9 - 09.6 mg/dL   Total Protein 7.5 6.5 - 8.1 g/dL   Albumin 4.0 3.5 - 5.0 g/dL   AST 18 15 - 41 U/L   ALT 14 0 - 44 U/L   Alkaline Phosphatase 67 38 - 126 U/L   Total Bilirubin 1.2 0.3 - 1.2 mg/dL   GFR calc non Af Amer >60 >60 mL/min   GFR calc Af Amer >60 >60 mL/min   Anion gap 10 5 - 15    Comment: Performed at [facility], [address] [place], [place] [address]  CBC with Differential     Status: Abnormal   Collection Time: 12/23/19  5:42 PM  Result Value Ref Range   WBC 9.7 4.0 - 10.5 K/uL   RBC 4.87 3.87 - 5.11 MIL/uL   Hemoglobin 12.2 12.0 - 15.0 g/dL   HCT 98.1 19.1 - 47.8 %   MCV 79.3 (L) 80.0 - 100.0 fL   MCH 25.1 (L) 26.0 - 34.0 pg   MCHC 31.6 30.0 - 36.0 g/dL   RDW 29.5 (H) 62.1 - 30.8 %   Platelets 263 150 - 400 K/uL   nRBC 0.0 0.0 - 0.2 %   Neutrophils Relative % 87 %   Neutro Abs 8.4 (H) 1.7 - 7.7 K/uL   Lymphocytes Relative 9 %   Lymphs Abs 0.8 0.7 - 4.0 K/uL   Monocytes Relative 4 %   Monocytes Absolute 0.4 0.1 - 1.0 K/uL   Eosinophils Relative 0 %   Eosinophils Absolute 0.0 0.0 - 0.5 K/uL   Basophils Relative 0 %   Basophils Absolute 0.0 0.0 - 0.1 K/uL   Immature Granulocytes 0 %   Abs Immature Granulocytes 0.03 0.00 - 0.07 K/uL    Comment: Performed at [facility], [address] [place], [place] [address]    Lipase, blood     Status: None   Collection Time: 12/23/19  5:42 PM  Result Value Ref Range   Lipase 24 11 - 51 U/L    Comment: Performed at [facility], [place] [place], [place] [address]    CT ABDOMEN PELVIS W CONTRAST  Result Date: 12/23/2019 CLINICAL DATA:  Abdominal pain and vomiting which has worsened since yesterday, question bowel obstruction EXAM: CT ABDOMEN AND PELVIS WITH CONTRAST TECHNIQUE: Multidetector CT imaging of the abdomen and pelvis was performed using the standard protocol following bolus administration of intravenous contrast. Sagittal and coronal MPR images reconstructed from axial data set. CONTRAST:  117mL OMNIPAQUE IOHEXOL 300 MG/ML SOLN IV. No oral contrast. COMPARISON:  12/22/2019 FINDINGS: Lower chest: Lung bases clear Hepatobiliary: Gallbladder surgically absent. Liver normal appearance. Pancreas: Normal appearance Spleen: Normal appearance Adrenals/Urinary Tract: Adrenal glands, kidneys, ureters, and bladder normal appearance Stomach/Bowel: Small amount of fluid within distal esophagus. Stomach unremarkable. Normal appendix. Colon decompressed. Dilated proximal and mid small bowel loops increased in diameter and number since prior study. Decompressed distal small bowel loops. Findings consistent with progressive small bowel obstruction. Transition zone appears to be in the RIGHT upper pelvis image 61, question adhesion. Associated mesenteric edema. Thickened small bowel loop seen on the previous exam does not appear thickened on current study. Vascular/Lymphatic: Vascular structures patent.  No adenopathy. Reproductive: Unremarkable uterus and adnexa Other: Low-attenuation free fluid in pelvis. No free air. Umbilical hernia containing fat. Musculoskeletal: No acute osseous findings. IMPRESSION: Progressive dilatation of proximal to mid small bowel loops with persistent decompression of distal small bowel consistent with progressive small bowel obstruction.  Transition zone is located in the anterior upper RIGHT pelvis, favor due to adhesion. Increased mesenteric edema of some of the small bowel loops and increased free pelvic fluid without evidence of perforation. Electronically Signed   By: Lavonia Dana M.D.   On: 12/23/2019 19:24   CT ABDOMEN PELVIS W CONTRAST  Result Date: 12/22/2019 CLINICAL DATA:  Diffuse lower abdominal pain radiating to left side beginning last night. Nausea and vomiting. EXAM: CT ABDOMEN AND PELVIS WITH CONTRAST TECHNIQUE: Multidetector CT imaging of the abdomen and pelvis was performed using the standard protocol following bolus administration of intravenous contrast. CONTRAST:  160mL OMNIPAQUE IOHEXOL 300 MG/ML  SOLN COMPARISON:  None. FINDINGS: Lower chest: Lung bases are normal Hepatobiliary: Previous cholecystectomy. Liver and biliary tree are normal. Pancreas: Normal. Spleen: Normal. Adrenals/Urinary Tract: Adrenal glands are normal. Kidneys are normal in size without hydronephrosis. Bladder is normal. Stomach/Bowel: Stomach is normal. There are a few patchy areas of air and fluid-filled mildly dilated small bowel in the mid to lower abdomen with intervening normal caliber small bowel. There is an adjacent nondilated small bowel loop in the right lower quadrant with wall thickening and mild stranding of the adjacent mesenteric fat. Appendix is normal.  Colon is unremarkable. Vascular/Lymphatic: Abdominal aorta is normal in caliber. No evidence of adenopathy. Reproductive: Within normal. Other: No free peritoneal air. Musculoskeletal: No acute findings. IMPRESSION: A few patchy areas of air and fluid-filled dilated small bowel loops in the mid to lower abdomen in addition to an adjacent thick-walled small bowel loop with stranding of the adjacent mesenteric fat. Findings may be due to a regional enteritis of infectious or inflammatory nature, although patchy areas of ileus or partial obstruction or possible. Electronically Signed   By:  Marin Olp M.D.   On: 12/22/2019 09:41   A/P: Linda Vang is an 38 y.o. female with likely pSBO vs enteritis - etiology clinically looking more like enteritis  as her exam is very benign and soft  -AF, normal vitals, normal wbc -Given radiographic concerns and her being here twice now in 24hrs, will plan for admission and SBO protocol -I discussed imaging findings and overall plan of care moving forward. We will pla to take things a day at a time - we did discuss if she ended up having some sort of obstructive process that did not improve or worsened, scenarios where surgery is necessary. -Accompanied with aid of translator as well  Stephanie Coup. Cliffton Asters, M.D. [facility] Use [email or web address] to contact on call provider

## 2019-12-23 NOTE — ED Provider Notes (Signed)
[facility] EMERGENCY DEPARTMENT Provider Note   CSN: [id number] Arrival date & time: 12/23/19  1333     History Chief Complaint  Patient presents with  . Abdominal Pain    Linda Vang is a 38 y.o. female with prior cholecystectomy and C-section presenting to the ED for ongoing generalized abdominal pain, vomiting and constipation.  States that 12/21/2019 at approximately 11 PM she started having left-sided abdominal pain.  Reports several episodes of nonbloody, nonbilious emesis.  She was seen and evaluated yesterday, CT scan showing enteritis versus possible partial small bowel obstruction.  Her lab work was normal.  She was evaluated by surgery but wanted to go home to manage her symptoms with medications.  She took pain medication and antiemetics today but continued to have symptoms.  States that she has not passed gas since yesterday.  She denies any urinary symptoms, shortness of breath, fever or vaginal complaints.  The history is provided by the patient. A language interpreter was used.       History reviewed. No pertinent past medical history.  There are no problems to display for this patient.   Past Surgical History:  Procedure Laterality Date  . CESAREAN SECTION    . CHOLECYSTECTOMY       OB History   No obstetric history on file.     No family history on file.  Social History   Tobacco Use  . Smoking status: Never Smoker  . Smokeless tobacco: Never Used  Substance Use Topics  . Alcohol use: Never  . Drug use: Never    Home Medications Prior to Admission medications   Medication Sig Start Date End Date Taking? Authorizing Provider  HYDROcodone-acetaminophen (NORCO/VICODIN) 5-325 MG tablet Take 1-2 tablets by mouth every 6 (six) hours as needed. 12/22/19   Volanda Napoleon, PA-C  ondansetron (ZOFRAN ODT) 4 MG disintegrating tablet Take 1 tablet (4 mg total) by mouth every 8 (eight) hours as needed for nausea or vomiting.  12/22/19   Volanda Napoleon, PA-C    Allergies    Patient has no known allergies.  Review of Systems   Review of Systems  Constitutional: Negative for appetite change, chills and fever.  HENT: Negative for ear pain, rhinorrhea, sneezing and sore throat.   Eyes: Negative for photophobia and visual disturbance.  Respiratory: Negative for cough, chest tightness, shortness of breath and wheezing.   Cardiovascular: Negative for chest pain and palpitations.  Gastrointestinal: Positive for abdominal pain, constipation, nausea and vomiting. Negative for blood in stool and diarrhea.  Genitourinary: Negative for dysuria, hematuria and urgency.  Musculoskeletal: Negative for myalgias.  Skin: Negative for rash.  Neurological: Negative for dizziness, weakness and light-headedness.    Physical Exam Updated Vital Signs BP 111/82   Pulse 72   Temp 97.9 F (36.6 C) (Oral)   Resp 17   SpO2 96%   Physical Exam Vitals and nursing note reviewed.  Constitutional:      General: She is not in acute distress.    Appearance: She is well-developed.  HENT:     Head: Normocephalic and atraumatic.     Nose: Nose normal.  Eyes:     General: No scleral icterus.       Left eye: No discharge.     Conjunctiva/sclera: Conjunctivae normal.  Cardiovascular:     Rate and Rhythm: Normal rate and regular rhythm.     Heart sounds: Normal heart sounds. No murmur. No friction rub. No gallop.   Pulmonary:  Effort: Pulmonary effort is normal. No respiratory distress.     Breath sounds: Normal breath sounds.  Abdominal:     General: Bowel sounds are normal. There is no distension.     Palpations: Abdomen is soft.     Tenderness: There is no abdominal tenderness. There is no guarding.  Musculoskeletal:        General: Normal range of motion.     Cervical back: Normal range of motion and neck supple.  Skin:    General: Skin is warm and dry.     Findings: No rash.  Neurological:     Mental Status: She is  alert.     Motor: No abnormal muscle tone.     Coordination: Coordination normal.     ED Results / Procedures / Treatments   Labs (all labs ordered are listed, but only abnormal results are displayed) Labs Reviewed  COMPREHENSIVE METABOLIC PANEL - Abnormal; Notable for the following components:      Result Value   Potassium 3.3 (*)    Glucose, Bld 140 (*)    All other components within normal limits  CBC WITH DIFFERENTIAL/PLATELET - Abnormal; Notable for the following components:   MCV 79.3 (*)    MCH 25.1 (*)    RDW 16.5 (*)    Neutro Abs 8.4 (*)    All other components within normal limits  SARS CORONAVIRUS 2 (TAT 6-24 HRS)  LIPASE, BLOOD    EKG None  Radiology CT ABDOMEN PELVIS W CONTRAST  Result Date: 12/23/2019 CLINICAL DATA:  Abdominal pain and vomiting which has worsened since yesterday, question bowel obstruction EXAM: CT ABDOMEN AND PELVIS WITH CONTRAST TECHNIQUE: Multidetector CT imaging of the abdomen and pelvis was performed using the standard protocol following bolus administration of intravenous contrast. Sagittal and coronal MPR images reconstructed from axial data set. CONTRAST:  100mL OMNIPAQUE IOHEXOL 300 MG/ML SOLN IV. No oral contrast. COMPARISON:  12/22/2019 FINDINGS: Lower chest: Lung bases clear Hepatobiliary: Gallbladder surgically absent. Liver normal appearance. Pancreas: Normal appearance Spleen: Normal appearance Adrenals/Urinary Tract: Adrenal glands, kidneys, ureters, and bladder normal appearance Stomach/Bowel: Small amount of fluid within distal esophagus. Stomach unremarkable. Normal appendix. Colon decompressed. Dilated proximal and mid small bowel loops increased in diameter and number since prior study. Decompressed distal small bowel loops. Findings consistent with progressive small bowel obstruction. Transition zone appears to be in the RIGHT upper pelvis image 61, question adhesion. Associated mesenteric edema. Thickened small bowel loop seen on  the previous exam does not appear thickened on current study. Vascular/Lymphatic: Vascular structures patent.  No adenopathy. Reproductive: Unremarkable uterus and adnexa Other: Low-attenuation free fluid in pelvis. No free air. Umbilical hernia containing fat. Musculoskeletal: No acute osseous findings. IMPRESSION: Progressive dilatation of proximal to mid small bowel loops with persistent decompression of distal small bowel consistent with progressive small bowel obstruction. Transition zone is located in the anterior upper RIGHT pelvis, favor due to adhesion. Increased mesenteric edema of some of the small bowel loops and increased free pelvic fluid without evidence of perforation. Electronically Signed   By: Ulyses SouthwardMark  Boles M.D.   On: 12/23/2019 19:24   CT ABDOMEN PELVIS W CONTRAST  Result Date: 12/22/2019 CLINICAL DATA:  Diffuse lower abdominal pain radiating to left side beginning last night. Nausea and vomiting. EXAM: CT ABDOMEN AND PELVIS WITH CONTRAST TECHNIQUE: Multidetector CT imaging of the abdomen and pelvis was performed using the standard protocol following bolus administration of intravenous contrast. CONTRAST:  100mL OMNIPAQUE IOHEXOL 300 MG/ML  SOLN COMPARISON:  None. FINDINGS: Lower chest: Lung bases are normal Hepatobiliary: Previous cholecystectomy. Liver and biliary tree are normal. Pancreas: Normal. Spleen: Normal. Adrenals/Urinary Tract: Adrenal glands are normal. Kidneys are normal in size without hydronephrosis. Bladder is normal. Stomach/Bowel: Stomach is normal. There are a few patchy areas of air and fluid-filled mildly dilated small bowel in the mid to lower abdomen with intervening normal caliber small bowel. There is an adjacent nondilated small bowel loop in the right lower quadrant with wall thickening and mild stranding of the adjacent mesenteric fat. Appendix is normal.  Colon is unremarkable. Vascular/Lymphatic: Abdominal aorta is normal in caliber. No evidence of adenopathy.  Reproductive: Within normal. Other: No free peritoneal air. Musculoskeletal: No acute findings. IMPRESSION: A few patchy areas of air and fluid-filled dilated small bowel loops in the mid to lower abdomen in addition to an adjacent thick-walled small bowel loop with stranding of the adjacent mesenteric fat. Findings may be due to a regional enteritis of infectious or inflammatory nature, although patchy areas of ileus or partial obstruction or possible. Electronically Signed   By: Elberta Fortis M.D.   On: 12/22/2019 09:41    Procedures Procedures (including critical care time)  Medications Ordered in ED Medications  morphine 4 MG/ML injection 4 mg (4 mg Intravenous Given 12/23/19 1746)  iohexol (OMNIPAQUE) 300 MG/ML solution 100 mL (100 mLs Intravenous Contrast Given 12/23/19 1859)    ED Course  I have reviewed the triage vital signs and the nursing notes.  Pertinent labs & imaging results that were available during my care of the patient were reviewed by me and considered in my medical decision making (see chart for details).  Clinical Course as of Dec 22 2036  Sat Dec 23, 2019  1816 Spoke to nurse who spoke to Dr. Cliffton Asters as he is currently in the OR.  Recommends obtaining a repeat CT scan and then consulting them if CT scan shows bowel obstruction.   [HK]  1840 CT scan shows progressive dilatation of small bowel concerning for partial small bowel obstruction.  I relayed these results to Dr. Cliffton Asters when he called me back and he will evaluate the patient.   [HK]    Clinical Course User Index [HK] Dietrich Pates, PA-C   MDM Rules/Calculators/A&P                      38 year old female with prior abdominal surgeries presents to ED for continued abdominal pain since yesterday.  CT yesterday showed enteritis versus possible partial small bowel obstruction.  Patient elected to be discharged home and manage her symptoms with medications.  She returns because she has ongoing symptoms despite the  medications, continued vomiting and pain.  She is afebrile here.  Normal white count noted on repeat labs today.  CT with worsening signs of partial small bowel obstruction.  She will be evaluated by general surgery.  Last meal was at 8 AM this morning.  Final Clinical Impression(s) / ED Diagnoses Final diagnoses:  Partial small bowel obstruction (HCC)    Rx / DC Orders ED Discharge Orders    None     Portions of this note were generated with Dragon dictation software. Dictation errors may occur despite best attempts at proofreading.    Dietrich Pates, PA-C 12/23/19 2037    Pricilla Loveless, MD 12/24/19 1520

## 2019-12-23 NOTE — ED Triage Notes (Signed)
Pt states she was seen in ED yesterday for abd pain and vomiting.  Returns because pain is worse.

## 2019-12-24 ENCOUNTER — Inpatient Hospital Stay (HOSPITAL_COMMUNITY): Payer: Medicaid Other

## 2019-12-24 LAB — CBC
HCT: 38.5 % (ref 36.0–46.0)
Hemoglobin: 12.3 g/dL (ref 12.0–15.0)
MCH: 24.8 pg — ABNORMAL LOW (ref 26.0–34.0)
MCHC: 31.9 g/dL (ref 30.0–36.0)
MCV: 77.6 fL — ABNORMAL LOW (ref 80.0–100.0)
Platelets: 319 10*3/uL (ref 150–400)
RBC: 4.96 MIL/uL (ref 3.87–5.11)
RDW: 16.9 % — ABNORMAL HIGH (ref 11.5–15.5)
WBC: 9.2 10*3/uL (ref 4.0–10.5)
nRBC: 0 % (ref 0.0–0.2)

## 2019-12-24 LAB — BASIC METABOLIC PANEL
Anion gap: 11 (ref 5–15)
BUN: 7 mg/dL (ref 6–20)
CO2: 25 mmol/L (ref 22–32)
Calcium: 9.1 mg/dL (ref 8.9–10.3)
Chloride: 103 mmol/L (ref 98–111)
Creatinine, Ser: 0.8 mg/dL (ref 0.44–1.00)
GFR calc Af Amer: >60 mL/min (ref >60)
GFR calc non Af Amer: >60 mL/min (ref >60)
Glucose, Bld: 110 mg/dL — ABNORMAL HIGH (ref 70–99)
Potassium: 2.9 mmol/L — ABNORMAL LOW (ref 3.5–5.1)
Sodium: 139 mmol/L (ref 135–145)

## 2019-12-24 LAB — HIV ANTIBODY (ROUTINE TESTING W REFLEX): HIV Screen 4th Generation wRfx: NONREACTIVE

## 2019-12-24 LAB — SARS CORONAVIRUS 2 (TAT 6-24 HRS): SARS Coronavirus 2: NEGATIVE

## 2019-12-24 MED ORDER — BISACODYL 10 MG RE SUPP
10.0000 mg | Freq: Once | RECTAL | Status: AC
  Administered 2019-12-24: 17:00:00 10 mg via RECTAL
  Filled 2019-12-24: qty 1

## 2019-12-24 MED ORDER — POTASSIUM CHLORIDE 10 MEQ/100ML IV SOLN
10.0000 meq | INTRAVENOUS | Status: AC
  Administered 2019-12-24 (×6): 10 meq via INTRAVENOUS
  Filled 2019-12-24 (×2): qty 100

## 2019-12-24 MED ORDER — ENOXAPARIN SODIUM 40 MG/0.4ML ~~LOC~~ SOLN
40.0000 mg | SUBCUTANEOUS | Status: DC
  Administered 2019-12-24 – 2020-01-03 (×11): 40 mg via SUBCUTANEOUS
  Filled 2019-12-24 (×11): qty 0.4

## 2019-12-24 MED ORDER — POTASSIUM CHLORIDE 20 MEQ/15ML (10%) PO SOLN
40.0000 meq | Freq: Once | ORAL | Status: AC
  Administered 2019-12-24: 17:00:00 40 meq via ORAL
  Filled 2019-12-24: qty 30

## 2019-12-24 NOTE — Progress Notes (Signed)
NG tube was placed at 0150 with immediate tan colored drainage. 350 mL of drainage upon insertion of NG tube. Patient placed on low intermittent suction. Xray notified to obtain Xray to verify placement.Pt tolerated procedure well.

## 2019-12-24 NOTE — Progress Notes (Signed)
Subjective/Chief Complaint: Pt with less abd pain today   Objective: Vital signs in last 24 hours: Temp:  [97.9 F (36.6 C)-99.6 F (37.6 C)] 97.9 F (36.6 C) (12/27 0727) Pulse Rate:  [65-97] 97 (12/27 0727) Resp:  [9-18] 17 (12/26 2230) BP: (105-126)/(75-90) 120/80 (12/27 0727) SpO2:  [95 %-100 %] 98 % (12/27 0727) Last BM Date: 12/20/19  Intake/Output from previous day: 12/26 0701 - 12/27 0700 In: 100 [P.O.:100] Out: 500 [Emesis/NG output:500] Intake/Output this shift: Total I/O In: 714 [I.V.:714] Out: -   PE: Constitutional: No acute distress, conversant, appears states age. Eyes: Anicteric sclerae, moist conjunctiva, no lid lag Lungs: Clear to auscultation bilaterally, normal respiratory effort CV: regular rate and rhythm, no murmurs, no peripheral edema, pedal pulses 2+ GI: Soft, no masses or hepatosplenomegaly, tender to palpation LUQ Skin: No rashes, palpation reveals normal turgor Psychiatric: appropriate judgment and insight, oriented to person, place, and time   Lab Results:  Recent Labs    12/23/19 1742 12/24/19 0513  WBC 9.7 9.2  HGB 12.2 12.3  HCT 38.6 38.5  PLT 263 319   BMET Recent Labs    12/23/19 1742 12/24/19 0513  NA 138 139  K 3.3* 2.9*  CL 106 103  CO2 22 25  GLUCOSE 140* 110*  BUN 8 7  CREATININE 0.68 0.80  CALCIUM 9.2 9.1   PT/INR No results for input(s): LABPROT, INR in the last 72 hours. ABG No results for input(s): PHART, HCO3 in the last 72 hours.  Invalid input(s): PCO2, PO2  Studies/Results: CT ABDOMEN PELVIS W CONTRAST  Result Date: 12/23/2019 CLINICAL DATA:  Abdominal pain and vomiting which has worsened since yesterday, question bowel obstruction EXAM: CT ABDOMEN AND PELVIS WITH CONTRAST TECHNIQUE: Multidetector CT imaging of the abdomen and pelvis was performed using the standard protocol following bolus administration of intravenous contrast. Sagittal and coronal MPR images reconstructed from axial data  set. CONTRAST:  132mL OMNIPAQUE IOHEXOL 300 MG/ML SOLN IV. No oral contrast. COMPARISON:  12/22/2019 FINDINGS: Lower chest: Lung bases clear Hepatobiliary: Gallbladder surgically absent. Liver normal appearance. Pancreas: Normal appearance Spleen: Normal appearance Adrenals/Urinary Tract: Adrenal glands, kidneys, ureters, and bladder normal appearance Stomach/Bowel: Small amount of fluid within distal esophagus. Stomach unremarkable. Normal appendix. Colon decompressed. Dilated proximal and mid small bowel loops increased in diameter and number since prior study. Decompressed distal small bowel loops. Findings consistent with progressive small bowel obstruction. Transition zone appears to be in the RIGHT upper pelvis image 61, question adhesion. Associated mesenteric edema. Thickened small bowel loop seen on the previous exam does not appear thickened on current study. Vascular/Lymphatic: Vascular structures patent.  No adenopathy. Reproductive: Unremarkable uterus and adnexa Other: Low-attenuation free fluid in pelvis. No free air. Umbilical hernia containing fat. Musculoskeletal: No acute osseous findings. IMPRESSION: Progressive dilatation of proximal to mid small bowel loops with persistent decompression of distal small bowel consistent with progressive small bowel obstruction. Transition zone is located in the anterior upper RIGHT pelvis, favor due to adhesion. Increased mesenteric edema of some of the small bowel loops and increased free pelvic fluid without evidence of perforation. Electronically Signed   By: Lavonia Dana M.D.   On: 12/23/2019 19:24   CT ABDOMEN PELVIS W CONTRAST  Result Date: 12/22/2019 CLINICAL DATA:  Diffuse lower abdominal pain radiating to left side beginning last night. Nausea and vomiting. EXAM: CT ABDOMEN AND PELVIS WITH CONTRAST TECHNIQUE: Multidetector CT imaging of the abdomen and pelvis was performed using the standard protocol following bolus administration  of intravenous  contrast. CONTRAST:  OMNIPAQUE IOHEXOL 300 MG/ML  SOLN COMPARISON:  None. FINDINGS: Lower chest: Lung bases are normal Hepatobiliary: Previous cholecystectomy. Liver and biliary tree are normal. Pancreas: Normal. Spleen: Normal. Adrenals/Urinary Tract: Adrenal glands are normal. Kidneys are normal in size without hydronephrosis. Bladder is normal. Stomach/Bowel: Stomach is normal. There are a few patchy areas of air and fluid-filled mildly dilated small bowel in the mid to lower abdomen with intervening normal caliber small bowel. There is an adjacent nondilated small bowel loop in the right lower quadrant with wall thickening and mild stranding of the adjacent mesenteric fat. Appendix is normal.  Colon is unremarkable. Vascular/Lymphatic: Abdominal aorta is normal in caliber. No evidence of adenopathy. Reproductive: Within normal. Other: No free peritoneal air. Musculoskeletal: No acute findings. IMPRESSION: A few patchy areas of air and fluid-filled dilated small bowel loops in the mid to lower abdomen in addition to an adjacent thick-walled small bowel loop with stranding of the adjacent mesenteric fat. Findings may be due to a regional enteritis of infectious or inflammatory nature, although patchy areas of ileus or partial obstruction or possible. Electronically Signed   By: Elberta Fortis M.D.   On: 12/22/2019 09:41   DG Abd Portable 1V-Small Bowel Protocol-Position Verification  Result Date: 12/24/2019 CLINICAL DATA:  NG placement EXAM: PORTABLE ABDOMEN - 1 VIEW COMPARISON:  CT abdomen pelvis 12/23/2019 FINDINGS: Transesophageal tube tip and side port are beyond the GE junction, terminating in the left upper quadrant in the region of the gastric body. Cholecystectomy clips in the right upper quadrant. Multiple air distended loops of bowel remain in the left upper quadrant. Some atelectatic changes are present in the lung bases. Cardiomediastinal contours are unremarkable. No acute osseous  abnormality. IMPRESSION: 1. Transesophageal tube tip and side port are beyond the GE junction, terminating in the region of the gastric body. 2. Multiple air distended loops of bowel remain in the left upper quadrant compatible with small-bowel obstruction. Electronically Signed   By: Kreg Shropshire M.D.   On: 12/24/2019 02:47     Assessment/Plan: 42F s/p pSBO -f/u SBOP film today -Con't NPO for now -replace electrolytes -mobilize  LOS: 1 day    Axel Filler 12/24/2019

## 2019-12-24 NOTE — Plan of Care (Signed)
  Problem: Pain Managment: Goal: General experience of comfort will improve Outcome: Progressing   Problem: Safety: Goal: Ability to remain free from injury will improve Outcome: Progressing   Problem: Skin Integrity: Goal: Risk for impaired skin integrity will decrease Outcome: Progressing   

## 2019-12-25 ENCOUNTER — Inpatient Hospital Stay (HOSPITAL_COMMUNITY): Payer: Medicaid Other

## 2019-12-25 MED ORDER — DIATRIZOATE MEGLUMINE & SODIUM 66-10 % PO SOLN
90.0000 mL | Freq: Once | ORAL | Status: AC
  Administered 2019-12-25: 11:00:00 90 mL via NASOGASTRIC
  Filled 2019-12-25: qty 90

## 2019-12-25 NOTE — Progress Notes (Signed)
Patient ID: Linda Vang, female   DOB: May 13, 1981, 38 y.o.   MRN: [id number] [facility] Progress Note:   * No surgery found *  Subjective: Mental status is clear;  Interrogated with Spanish interpreter Objective: Vital signs in last 24 hours: Temp:  [98.5 F (36.9 C)-100.4 F (38 C)] 99.5 F (37.5 C) (12/28 0809) Pulse Rate:  [72-93] 75 (12/28 0809) Resp:  [18] 18 (12/28 0809) BP: (103-119)/(66-78) 103/66 (12/28 0809) SpO2:  [95 %-99 %] 99 % (12/28 0809) Weight:  [89 kg] 89 kg (12/27 1600)  Intake/Output from previous day: 12/27 0701 - 12/28 0700 In: 714 [I.V.:714] Out: 1350 [Emesis/NG output:1350] Intake/Output this shift: No intake/output data recorded.  Physical Exam: Work of breathing is normal;  NG in place with light brown return;  nontender to palpation  Lab Results:  Results for orders placed or performed during the hospital encounter of 12/23/19 (from the past 48 hour(s))  Comprehensive metabolic panel     Status: Abnormal   Collection Time: 12/23/19  5:42 PM  Result Value Ref Range   Sodium 138 135 - 145 mmol/L   Potassium 3.3 (L) 3.5 - 5.1 mmol/L   Chloride 106 98 - 111 mmol/L   CO2 22 22 - 32 mmol/L   Glucose, Bld 140 (H) 70 - 99 mg/dL   BUN 8 6 - 20 mg/dL   Creatinine, Ser 9.140.68 0.44 - 1.00 mg/dL   Calcium 9.2 8.9 - 78.210.3 mg/dL   Total Protein 7.5 6.5 - 8.1 g/dL   Albumin 4.0 3.5 - 5.0 g/dL   AST 18 15 - 41 U/L   ALT 14 0 - 44 U/L   Alkaline Phosphatase 67 38 - 126 U/L   Total Bilirubin 1.2 0.3 - 1.2 mg/dL   GFR calc non Af Amer >60 >60 mL/min   GFR calc Af Amer >60 >60 mL/min   Anion gap 10 5 - 15    Comment: Performed at [facility], [address] [place], [place] [address]  CBC with Differential     Status: Abnormal   Collection Time: 12/23/19  5:42 PM  Result Value Ref Range   WBC 9.7 4.0 - 10.5 K/uL   RBC 4.87 3.87 - 5.11 MIL/uL   Hemoglobin 12.2 12.0 - 15.0 g/dL   HCT 30.838.6 65.736.0 - 84.646.0 %   MCV 79.3 (L) 80.0 -  100.0 fL   MCH 25.1 (L) 26.0 - 34.0 pg   MCHC 31.6 30.0 - 36.0 g/dL   RDW 96.216.5 (H) 95.211.5 - 84.115.5 %   Platelets 263 150 - 400 K/uL   nRBC 0.0 0.0 - 0.2 %   Neutrophils Relative % 87 %   Neutro Abs 8.4 (H) 1.7 - 7.7 K/uL   Lymphocytes Relative 9 %   Lymphs Abs 0.8 0.7 - 4.0 K/uL   Monocytes Relative 4 %   Monocytes Absolute 0.4 0.1 - 1.0 K/uL   Eosinophils Relative 0 %   Eosinophils Absolute 0.0 0.0 - 0.5 K/uL   Basophils Relative 0 %   Basophils Absolute 0.0 0.0 - 0.1 K/uL   Immature Granulocytes 0 %   Abs Immature Granulocytes 0.03 0.00 - 0.07 K/uL    Comment: Performed at [facility], [address] [place], [place] [address]  Lipase, blood     Status: None   Collection Time: 12/23/19  5:42 PM  Result Value Ref Range   Lipase 24 11 - 51 U/L    Comment: Performed at  [facility], [place] [address] [place], [place] [address]  SARS CORONAVIRUS 2 (TAT 6-24 HRS) Nasopharyngeal Nasopharyngeal Swab     Status: None   Collection Time: 12/23/19  9:38 PM   Specimen: Nasopharyngeal Swab  Result Value Ref Range   SARS Coronavirus 2 NEGATIVE NEGATIVE    Comment: (NOTE) SARS-CoV-2 target nucleic acids are NOT DETECTED. The SARS-CoV-2 RNA is generally detectable in upper and lower respiratory specimens during the acute phase of infection. Negative results do not preclude SARS-CoV-2 infection, do not rule out co-infections with other pathogens, and should not be used as the sole basis for treatment or other patient management decisions. Negative results must be combined with clinical observations, patient history, and epidemiological information. The expected result is Negative. Fact Sheet for Patients: SugarRoll.be Fact Sheet for Healthcare Providers: https://www.woods-mathews.com/ This test is not yet approved or cleared by the [place] FDA and  has been authorized for detection and/or diagnosis of SARS-CoV-2 by FDA under an  Emergency Use Authorization (EUA). This EUA will remain  in effect (meaning this test can be used) for the duration of the COVID-19 declaration under Section 56 4(b)(1) of the Act, 21 U.S.C. section 360bbb-3(b)(1), unless the authorization is terminated or revoked sooner. Performed at [facility], [place] [address], [place], [place] [address]   HIV Antibody (routine testing w rflx)     Status: None   Collection Time: 12/24/19  5:13 AM  Result Value Ref Range   HIV Screen 4th Generation wRfx NON REACTIVE NON REACTIVE    Comment: Performed at [place] [address], [place], [place] [address]  CBC     Status: Abnormal   Collection Time: 12/24/19  5:13 AM  Result Value Ref Range   WBC 9.2 4.0 - 10.5 K/uL   RBC 4.96 3.87 - 5.11 MIL/uL   Hemoglobin 12.3 12.0 - 15.0 g/dL   HCT 38.5 36.0 - 46.0 %   MCV 77.6 (L) 80.0 - 100.0 fL   MCH 24.8 (L) 26.0 - 34.0 pg   MCHC 31.9 30.0 - 36.0 g/dL   RDW 16.9 (H) 11.5 - 15.5 %   Platelets 319 150 - 400 K/uL   nRBC 0.0 0.0 - 0.2 %    Comment: Performed at [facility], [place] [address] [place], [place] [address]  Basic metabolic panel     Status: Abnormal   Collection Time: 12/24/19  5:13 AM  Result Value Ref Range   Sodium 139 135 - 145 mmol/L   Potassium 2.9 (L) 3.5 - 5.1 mmol/L   Chloride 103 98 - 111 mmol/L   CO2 25 22 - 32 mmol/L   Glucose, Bld 110 (H) 70 - 99 mg/dL   BUN 7 6 - 20 mg/dL   Creatinine, Ser 0.80 0.44 - 1.00 mg/dL   Calcium 9.1 8.9 - 10.3 mg/dL   GFR calc non Af Amer >60 >60 mL/min   GFR calc Af Amer >60 >60 mL/min   Anion gap 11 5 - 15    Comment: Performed at [place] [address], [place], [place] [address]    Radiology/Results: CT ABDOMEN PELVIS W CONTRAST  Result Date: 12/23/2019 CLINICAL DATA:  Abdominal pain and vomiting which has worsened since yesterday, question bowel obstruction EXAM: CT ABDOMEN AND PELVIS WITH CONTRAST TECHNIQUE: Multidetector CT imaging of the abdomen and  pelvis was performed using the standard protocol following bolus administration of intravenous contrast. Sagittal and coronal MPR images  reconstructed from axial data set. CONTRAST:  OMNIPAQUE IOHEXOL 300 MG/ML SOLN IV. No oral contrast. COMPARISON:  12/22/2019 FINDINGS: Lower chest: Lung bases clear Hepatobiliary: Gallbladder surgically absent. Liver normal appearance. Pancreas: Normal appearance Spleen: Normal appearance Adrenals/Urinary Tract: Adrenal glands, kidneys, ureters, and bladder normal appearance Stomach/Bowel: Small amount of fluid within distal esophagus. Stomach unremarkable. Normal appendix. Colon decompressed. Dilated proximal and mid small bowel loops increased in diameter and number since prior study. Decompressed distal small bowel loops. Findings consistent with progressive small bowel obstruction. Transition zone appears to be in the RIGHT upper pelvis image 61, question adhesion. Associated mesenteric edema. Thickened small bowel loop seen on the previous exam does not appear thickened on current study. Vascular/Lymphatic: Vascular structures patent.  No adenopathy. Reproductive: Unremarkable uterus and adnexa Other: Low-attenuation free fluid in pelvis. No free air. Umbilical hernia containing fat. Musculoskeletal: No acute osseous findings. IMPRESSION: Progressive dilatation of proximal to mid small bowel loops with persistent decompression of distal small bowel consistent with progressive small bowel obstruction. Transition zone is located in the anterior upper RIGHT pelvis, favor due to adhesion. Increased mesenteric edema of some of the small bowel loops and increased free pelvic fluid without evidence of perforation. Electronically Signed   By: Ulyses Southward M.D.   On: 12/23/2019 19:24   DG Abd 2 Views  Result Date: 12/25/2019 CLINICAL DATA:  Abdominal pain and vomiting. Evaluate small-bowel obstruction. EXAM: ABDOMEN - 2 VIEW COMPARISON:  12/24/2019 FINDINGS: The NG tube is in  the stomach. Persistent air distended small bowel loops with air-fluid levels consistent with obstruction. Some residual air and stool in the right colon. No free air. The lung bases are grossly clear. IMPRESSION: 1. NG tube in stable position in the stomach. 2. Persistent small-bowel obstruction bowel gas pattern but no free air. Electronically Signed   By: Rudie Meyer M.D.   On: 12/25/2019 07:27   DG Abd Portable 1V-Small Bowel Obstruction Protocol-initial, 8 hr delay  Result Date: 12/24/2019 CLINICAL DATA:  Small-bowel obstruction EXAM: PORTABLE ABDOMEN - 1 VIEW COMPARISON:  Portable exam 1213 hours repeated at 1227 hours compared to 0232 hours as well as prior CT of 12/23/2019 FINDINGS: Nasogastric tube projects over proximal stomach. Dilated small bowel loops in the upper mid and LEFT abdomen. Upper normal fold thickness on a portion of a single small bowel loop in the LEFT upper quadrant. Gas and stool in colon. No definite urinary tract calcification or acute osseous findings. IMPRESSION: Small-bowel obstruction; when compared to the prior CT exam, fewer dilated small bowel loops are identified on current radiographic exam. Electronically Signed   By: Ulyses Southward M.D.   On: 12/24/2019 13:12   DG Abd Portable 1V-Small Bowel Protocol-Position Verification  Result Date: 12/24/2019 CLINICAL DATA:  NG placement EXAM: PORTABLE ABDOMEN - 1 VIEW COMPARISON:  CT abdomen pelvis 12/23/2019 FINDINGS: Transesophageal tube tip and side port are beyond the GE junction, terminating in the left upper quadrant in the region of the gastric body. Cholecystectomy clips in the right upper quadrant. Multiple air distended loops of bowel remain in the left upper quadrant. Some atelectatic changes are present in the lung bases. Cardiomediastinal contours are unremarkable. No acute osseous abnormality. IMPRESSION: 1. Transesophageal tube tip and side port are beyond the GE junction, terminating in the region of the gastric  body. 2. Multiple air distended loops of bowel remain in the left upper quadrant compatible with small-bowel obstruction. Electronically Signed   By: Kreg Shropshire M.D.   On:  12/24/2019 02:47    Anti-infectives: Anti-infectives (From admission, onward)   None      Assessment/Plan: Problem List: Patient Active Problem List   Diagnosis Date Noted  . SBO (small bowel obstruction) (HCC) 12/23/2019    Will repeat contrast per NG with clamping.  Followup xray in am. * No surgery found *    LOS: 2 days   Matt B. Daphine Deutscher, MD, [facility] [phone] beeper [phone]  12/25/2019 10:22 AM

## 2019-12-25 NOTE — Plan of Care (Signed)
  Problem: Pain Managment: Goal: General experience of comfort will improve Outcome: Progressing   Problem: Safety: Goal: Ability to remain free from injury will improve Outcome: Progressing   Problem: Skin Integrity: Goal: Risk for impaired skin integrity will decrease Outcome: Progressing   

## 2019-12-26 ENCOUNTER — Inpatient Hospital Stay (HOSPITAL_COMMUNITY): Payer: Medicaid Other

## 2019-12-26 LAB — BASIC METABOLIC PANEL
Anion gap: 12 (ref 5–15)
BUN: 11 mg/dL (ref 6–20)
CO2: 24 mmol/L (ref 22–32)
Calcium: 8.7 mg/dL — ABNORMAL LOW (ref 8.9–10.3)
Chloride: 103 mmol/L (ref 98–111)
Creatinine, Ser: 0.7 mg/dL (ref 0.44–1.00)
GFR calc Af Amer: >60 mL/min (ref >60)
GFR calc non Af Amer: >60 mL/min (ref >60)
Glucose, Bld: 79 mg/dL (ref 70–99)
Potassium: 3.2 mmol/L — ABNORMAL LOW (ref 3.5–5.1)
Sodium: 139 mmol/L (ref 135–145)

## 2019-12-26 LAB — GC/CHLAMYDIA PROBE AMP (~~LOC~~) NOT AT ARMC
Chlamydia: NEGATIVE
Neisseria Gonorrhea: NEGATIVE

## 2019-12-26 LAB — MAGNESIUM: Magnesium: 1.8 mg/dL (ref 1.7–2.4)

## 2019-12-26 MED ORDER — MENTHOL 3 MG MT LOZG
1.0000 | LOZENGE | OROMUCOSAL | Status: DC | PRN
  Administered 2019-12-26 – 2019-12-29 (×3): 3 mg via ORAL
  Filled 2019-12-26 (×2): qty 9

## 2019-12-26 MED ORDER — POTASSIUM CHLORIDE 10 MEQ/100ML IV SOLN
10.0000 meq | INTRAVENOUS | Status: AC
  Administered 2019-12-26 (×5): 10 meq via INTRAVENOUS
  Filled 2019-12-26 (×5): qty 100

## 2019-12-26 MED ORDER — PHENOL 1.4 % MT LIQD
1.0000 | OROMUCOSAL | Status: DC | PRN
  Administered 2019-12-26: 11:00:00 1 via OROMUCOSAL
  Filled 2019-12-26: qty 177

## 2019-12-26 MED ORDER — MAGNESIUM SULFATE 2 GM/50ML IV SOLN
2.0000 g | Freq: Once | INTRAVENOUS | Status: AC
  Administered 2019-12-26: 18:00:00 2 g via INTRAVENOUS
  Filled 2019-12-26: qty 50

## 2019-12-26 NOTE — Progress Notes (Addendum)
Subjective: CC: Spanish interpreter used Patient reports abdominal pain, bloating and nausea has resolved. She is still not passing any flatus or had a BM. She is mobilizing without difficulty. She is having quite a bit of discomfort from NGT. 1600 out from NGT/24 hours.   Objective: Vital signs in last 24 hours: Temp:  [98.3 F (36.8 C)-99 F (37.2 C)] 98.7 F (37.1 C) (12/29 0809) Pulse Rate:  [75-91] 75 (12/29 0809) Resp:  [17-18] 18 (12/29 0809) BP: (107-128)/(68-80) 111/71 (12/29 0809) SpO2:  [97 %-99 %] 99 % (12/29 0809) Last BM Date: 12/20/19  Intake/Output from previous day: 12/28 0701 - 12/29 0700 In: 1100 [P.O.:100; I.V.:1000] Out: 1600 [Emesis/NG output:1600] Intake/Output this shift: Total I/O In: 0  Out: 650 [Emesis/NG output:650]  PE: Gen:  Alert, NAD, pleasant HEENT: NGT in place. 1600cc/24 hours that is brown in color.  Pulm: Normal rate and effort  Abd: Soft, NT/ND, hypoactive bowel sounds.   Ext:  No LE edema  Psych: A&Ox3  Skin: no rashes noted, warm and dry  Lab Results:  Recent Labs    12/23/19 1742 12/24/19 0513  WBC 9.7 9.2  HGB 12.2 12.3  HCT 38.6 38.5  PLT 263 319   BMET Recent Labs    12/23/19 1742 12/24/19 0513  NA 138 139  K 3.3* 2.9*  CL 106 103  CO2 22 25  GLUCOSE 140* 110*  BUN 8 7  CREATININE 0.68 0.80  CALCIUM 9.2 9.1   PT/INR No results for input(s): LABPROT, INR in the last 72 hours. CMP     Component Value Date/Time   NA 139 12/24/2019 0513   K 2.9 (L) 12/24/2019 0513   CL 103 12/24/2019 0513   CO2 25 12/24/2019 0513   GLUCOSE 110 (H) 12/24/2019 0513   BUN 7 12/24/2019 0513   CREATININE 0.80 12/24/2019 0513   CALCIUM 9.1 12/24/2019 0513   PROT 7.5 12/23/2019 1742   ALBUMIN 4.0 12/23/2019 1742   AST 18 12/23/2019 1742   ALT 14 12/23/2019 1742   ALKPHOS 67 12/23/2019 1742   BILITOT 1.2 12/23/2019 1742   GFRNONAA >60 12/24/2019 0513   GFRAA >60 12/24/2019 0513   Lipase     Component Value  Date/Time   LIPASE 24 12/23/2019 1742       Studies/Results: DG Abd 2 Views  Result Date: 12/25/2019 CLINICAL DATA:  Abdominal pain and vomiting. Evaluate small-bowel obstruction. EXAM: ABDOMEN - 2 VIEW COMPARISON:  12/24/2019 FINDINGS: The NG tube is in the stomach. Persistent air distended small bowel loops with air-fluid levels consistent with obstruction. Some residual air and stool in the right colon. No free air. The lung bases are grossly clear. IMPRESSION: 1. NG tube in stable position in the stomach. 2. Persistent small-bowel obstruction bowel gas pattern but no free air. Electronically Signed   By: Marijo Sanes M.D.   On: 12/25/2019 07:27   DG Abd Portable 1V-Small Bowel Obstruction Protocol-24 hr delay  Result Date: 12/26/2019 CLINICAL DATA:  Small bowel obstruction. 24 hour delay. EXAM: PORTABLE ABDOMEN - 1 VIEW COMPARISON:  Radiograph yesterday. CT 12/23/2019 FINDINGS: Persistent dilated small bowel in the central abdomen. Small bowel distention up to 5.3 cm. There is no enteric contrast visualized in the colon. Cholecystectomy clips in the right upper quadrant. No evidence of free air. IMPRESSION: Persistent small bowel dilatation consistent with obstruction. No enteric contrast visualized in the colon. Electronically Signed   By: Keith Rake M.D.   On:  12/26/2019 06:56   DG Abd Portable 1V-Small Bowel Obstruction Protocol-initial, 8 hr delay  Result Date: 12/25/2019 CLINICAL DATA:  Small-bowel obstruction.  8 hour delay. EXAM: PORTABLE ABDOMEN - 1 VIEW COMPARISON:  December 25, 2019 at 7 a.m. FINDINGS: There are persistent dilated loops of small bowel scattered throughout the abdomen measuring up to approximately 4.5 cm. The majority of the oral contrast appears to reside within the small bowel and perhaps the stomach. The enteric tube is partially visualized. There is no definite oral contrast visualized within the colon on this exam. IMPRESSION: Persistent small bowel  obstruction with the majority of the oral contrast remaining within the small bowel. Electronically Signed   By: Katherine Mantle M.D.   On: 12/25/2019 20:30   DG Abd Portable 1V-Small Bowel Obstruction Protocol-initial, 8 hr delay  Result Date: 12/24/2019 CLINICAL DATA:  Small-bowel obstruction EXAM: PORTABLE ABDOMEN - 1 VIEW COMPARISON:  Portable exam 1213 hours repeated at 1227 hours compared to 0232 hours as well as prior CT of 12/23/2019 FINDINGS: Nasogastric tube projects over proximal stomach. Dilated small bowel loops in the upper mid and LEFT abdomen. Upper normal fold thickness on a portion of a single small bowel loop in the LEFT upper quadrant. Gas and stool in colon. No definite urinary tract calcification or acute osseous findings. IMPRESSION: Small-bowel obstruction; when compared to the prior CT exam, fewer dilated small bowel loops are identified on current radiographic exam. Electronically Signed   By: Ulyses Southward M.D.   On: 12/24/2019 13:12    Anti-infectives: Anti-infectives (From admission, onward)   None       Assessment/Plan SBO vs enteritis w/ ileus  - Hx of C-Sections x 3 and GB in the past - SBO protocol done x 2. Todays AM xray without contrast in colon - Patients abdominal pain, nausea and bloating has resolved. Still no bowel function. NGt w/ 1.6L in the last 24 hours. Unable to see NGT on xray this am to determine if high output is from NGT being in duodenum. Continue NGT on LIWS. AM xray and labs. We discussed the possibility of requiring surgery if she does not open up.  - Keep Mg >2 and K>4 for bowel function - Mobilize for bowel function  FEN - NPO, NGT, IVF, Replace K VTE - SCDs, Lovenox ID - None   LOS: 3 days    Jacinto Halim , [facility] 12/26/2019, 11:38 AM Please see Amion for pager number during day hours 7:00am-4:30pm

## 2019-12-26 NOTE — Progress Notes (Signed)
Abd is soft, passing gas. Cramping noted. NGT to low intermittent suction. Encouraged pt to get up and ambulate. Ambulating in the room. Sat in the chair several times today.

## 2019-12-26 NOTE — Plan of Care (Signed)
  Problem: Education: Goal: Knowledge of General Education information will improve Description: Including pain rating scale, medication(s)/side effects and non-pharmacologic comfort measures Outcome: Progressing   Problem: Safety: Goal: Ability to remain free from injury will improve Outcome: Progressing   

## 2019-12-27 ENCOUNTER — Inpatient Hospital Stay (HOSPITAL_COMMUNITY): Payer: Medicaid Other

## 2019-12-27 LAB — BASIC METABOLIC PANEL
Anion gap: 12 (ref 5–15)
BUN: 9 mg/dL (ref 6–20)
CO2: 21 mmol/L — ABNORMAL LOW (ref 22–32)
Calcium: 8.6 mg/dL — ABNORMAL LOW (ref 8.9–10.3)
Chloride: 104 mmol/L (ref 98–111)
Creatinine, Ser: 0.61 mg/dL (ref 0.44–1.00)
GFR calc Af Amer: >60 mL/min (ref >60)
GFR calc non Af Amer: >60 mL/min (ref >60)
Glucose, Bld: 82 mg/dL (ref 70–99)
Potassium: 3.8 mmol/L (ref 3.5–5.1)
Sodium: 137 mmol/L (ref 135–145)

## 2019-12-27 LAB — CBC
HCT: 33.1 % — ABNORMAL LOW (ref 36.0–46.0)
Hemoglobin: 10.6 g/dL — ABNORMAL LOW (ref 12.0–15.0)
MCH: 25 pg — ABNORMAL LOW (ref 26.0–34.0)
MCHC: 32 g/dL (ref 30.0–36.0)
MCV: 78.1 fL — ABNORMAL LOW (ref 80.0–100.0)
Platelets: 257 10*3/uL (ref 150–400)
RBC: 4.24 MIL/uL (ref 3.87–5.11)
RDW: 16.2 % — ABNORMAL HIGH (ref 11.5–15.5)
WBC: 5.8 10*3/uL (ref 4.0–10.5)
nRBC: 0 % (ref 0.0–0.2)

## 2019-12-27 MED ORDER — ASPIRIN EC 81 MG PO TBEC: 162.0000 mg | DELAYED_RELEASE_TABLET | Freq: Once | ORAL | Status: DC

## 2019-12-27 MED ORDER — ASPIRIN 81 MG PO CHEW
162.0000 mg | CHEWABLE_TABLET | Freq: Once | ORAL | Status: AC
  Administered 2019-12-27: 03:00:00 162 mg via ORAL
  Filled 2019-12-27: qty 2

## 2019-12-27 MED ORDER — PROMETHAZINE HCL 12.5 MG RE SUPP
25.0000 mg | Freq: Four times a day (QID) | RECTAL | Status: DC | PRN
  Administered 2019-12-27: 21:00:00 25 mg via RECTAL
  Filled 2019-12-27: qty 2

## 2019-12-27 NOTE — Progress Notes (Signed)
Spoke with Dr. Hassell Done, new orders to make patient NPO and give phenergan suppository 25mg  q 6 hours prn for nausea and vomiting. He also ordered a flat plane and up right abdominal x ray for the am

## 2019-12-27 NOTE — Progress Notes (Signed)
Communicated to patient the I pad  Interpreter service. This patient verbalized under standing of orders given by MD. Patient is aware that she is npo

## 2019-12-27 NOTE — Plan of Care (Signed)
Pt complaining of severe throat pains. PRNs, ice chips & throat spray given   Problem: Education: Goal: Knowledge of General Education information will improve Description: Including pain rating scale, medication(s)/side effects and non-pharmacologic comfort measures Outcome: Progressing   Problem: Safety: Goal: Ability to remain free from injury will improve Outcome: Progressing

## 2019-12-27 NOTE — Progress Notes (Signed)
NGT removed this am per MD orders. Pt did have some N/V after she drank some clear liquid, she appeared more distended but abdomen is soft without noted tenderness. Hypo active bowel sounds. Encouraged patient to ambulate, she was able to ambulate around a small section for the unit. Now in bed with HOB elevated. Will notify MD> Dr. Rodman Key

## 2019-12-27 NOTE — Progress Notes (Signed)
Notified Dr. Rodena Piety of patients n/v and pain to abdomen. No new orders given just to encouraged oob activity and ambulation.

## 2019-12-27 NOTE — Progress Notes (Signed)
Patient ID: Linda Vang, female   DOB: Jun 30, 1981, 38 y.o.   MRN: [id number] [facility] Progress Note:   * No surgery found *  Subjective: Mental status is clear;  Interviewed with interpreter Objective: Vital signs in last 24 hours: Temp:  [98.7 F (37.1 C)-99.3 F (37.4 C)] 99 F (37.2 C) (12/30 0806) Pulse Rate:  [68-76] 68 (12/30 0806) Resp:  [16-18] 16 (12/30 0806) BP: (102-117)/(64-80) 102/64 (12/30 0806) SpO2:  [96 %-99 %] 97 % (12/30 0806)  Intake/Output from previous day: 12/29 0701 - 12/30 0700 In: 1250 [P.O.:100; I.V.:1000; IV Piggyback:150] Out: 1600 [Emesis/NG output:1600] Intake/Output this shift: Total I/O In: -  Out: 300 [Emesis/NG output:300]  Physical Exam: Work of breathing is normal.  Abdomen is soft and nontender;  Passing flatus  Lab Results:  Results for orders placed or performed during the hospital encounter of 12/23/19 (from the past 48 hour(s))  Magnesium     Status: None   Collection Time: 12/26/19 11:21 AM  Result Value Ref Range   Magnesium 1.8 1.7 - 2.4 mg/dL    Comment: Performed at [facility], [place] [address], [place], [place] [address]  Basic metabolic panel     Status: Abnormal   Collection Time: 12/26/19 11:21 AM  Result Value Ref Range   Sodium 139 135 - 145 mmol/L   Potassium 3.2 (L) 3.5 - 5.1 mmol/L   Chloride 103 98 - 111 mmol/L   CO2 24 22 - 32 mmol/L   Glucose, Bld 79 70 - 99 mg/dL   BUN 11 6 - 20 mg/dL   Creatinine, Ser 0.70 0.44 - 1.00 mg/dL   Calcium 8.7 (L) 8.9 - 10.3 mg/dL   GFR calc non Af Amer >60 >60 mL/min   GFR calc Af Amer >60 >60 mL/min   Anion gap 12 5 - 15    Comment: Performed at [place] [address], [place], [place] [address]  CBC     Status: Abnormal   Collection Time: 12/27/19  5:27 AM  Result Value Ref Range   WBC 5.8 4.0 - 10.5 K/uL   RBC 4.24 3.87 - 5.11 MIL/uL   Hemoglobin 10.6 (L) 12.0 - 15.0 g/dL   HCT 33.1 (L) 36.0 - 46.0 %   MCV 78.1 (L) 80.0 - 100.0  fL   MCH 25.0 (L) 26.0 - 34.0 pg   MCHC 32.0 30.0 - 36.0 g/dL   RDW 16.2 (H) 11.5 - 15.5 %   Platelets 257 150 - 400 K/uL   nRBC 0.0 0.0 - 0.2 %    Comment: Performed at [place] [address], [place], [place] [address]  Basic metabolic panel     Status: Abnormal   Collection Time: 12/27/19  5:27 AM  Result Value Ref Range   Sodium 137 135 - 145 mmol/L   Potassium 3.8 3.5 - 5.1 mmol/L   Chloride 104 98 - 111 mmol/L   CO2 21 (L) 22 - 32 mmol/L   Glucose, Bld 82 70 - 99 mg/dL   BUN 9 6 - 20 mg/dL   Creatinine, Ser 0.61 0.44 - 1.00 mg/dL   Calcium 8.6 (L) 8.9 - 10.3 mg/dL   GFR calc non Af Amer >60 >60 mL/min   GFR calc Af Amer >60 >60 mL/min   Anion gap 12 5 - 15    Comment: Performed at [place] [address] [place], [place] [address]    Radiology/Results: [place]  Abd Portable 1V  Result Date: 12/27/2019 CLINICAL DATA:  Small bowel obstruction. EXAM: PORTABLE ABDOMEN - 1 VIEW COMPARISON:  Multiple prior radiographs most recently yesterday. CT 12/23/2019 FINDINGS: Persistent gaseous distention of small bowel in the central abdomen, equivocal improvement from yesterday. Persistent small bowel dilatation to 4.2 cm. Small volume of stool in the hepatic flexure of the colon. No gross evidence of free air. IMPRESSION: Persistent small bowel distension centrally, with equivocal improvement from yesterday. Electronically Signed   By: Narda Rutherford M.D.   On: 12/27/2019 06:00   DG Abd Portable 1V-Small Bowel Obstruction Protocol-24 hr delay  Result Date: 12/26/2019 CLINICAL DATA:  Small bowel obstruction. 24 hour delay. EXAM: PORTABLE ABDOMEN - 1 VIEW COMPARISON:  Radiograph yesterday. CT 12/23/2019 FINDINGS: Persistent dilated small bowel in the central abdomen. Small bowel distention up to 5.3 cm. There is no enteric contrast visualized in the colon. Cholecystectomy clips in the right upper quadrant. No evidence of free air. IMPRESSION: Persistent small bowel  dilatation consistent with obstruction. No enteric contrast visualized in the colon. Electronically Signed   By: Narda Rutherford M.D.   On: 12/26/2019 06:56   DG Abd Portable 1V-Small Bowel Obstruction Protocol-initial, 8 hr delay  Result Date: 12/25/2019 CLINICAL DATA:  Small-bowel obstruction.  8 hour delay. EXAM: PORTABLE ABDOMEN - 1 VIEW COMPARISON:  December 25, 2019 at 7 a.m. FINDINGS: There are persistent dilated loops of small bowel scattered throughout the abdomen measuring up to approximately 4.5 cm. The majority of the oral contrast appears to reside within the small bowel and perhaps the stomach. The enteric tube is partially visualized. There is no definite oral contrast visualized within the colon on this exam. IMPRESSION: Persistent small bowel obstruction with the majority of the oral contrast remaining within the small bowel. Electronically Signed   By: Katherine Mantle M.D.   On: 12/25/2019 20:30    Anti-infectives: Anti-infectives (From admission, onward)   None      Assessment/Plan: Problem List: Patient Active Problem List   Diagnosis Date Noted  . SBO (small bowel obstruction) (HCC) 12/23/2019    Will discontinue NG and start clear liquids.   * No surgery found *    LOS: 4 days   Matt B. Daphine Deutscher, MD, [facility] [phone] beeper [phone]  12/27/2019 9:13 AM

## 2019-12-27 NOTE — Progress Notes (Signed)
Patient had another bout of projectile vomiting, greenish/brown fluid. Patient states she continues to have pain to abdomen and nausea. Will call MD Dr. Hassell Done.

## 2019-12-28 ENCOUNTER — Inpatient Hospital Stay (HOSPITAL_COMMUNITY): Payer: Medicaid Other | Admitting: Certified Registered Nurse Anesthetist

## 2019-12-28 ENCOUNTER — Inpatient Hospital Stay (HOSPITAL_COMMUNITY): Payer: Medicaid Other

## 2019-12-28 ENCOUNTER — Encounter (HOSPITAL_COMMUNITY): Admission: EM | Disposition: A | Discharge status: Home / Self Care | Payer: Self-pay | Source: Home / Self Care

## 2019-12-28 HISTORY — PX: BOWEL RESECTION: SHX1257

## 2019-12-28 HISTORY — PX: LAPAROTOMY: SHX154

## 2019-12-28 HISTORY — PX: LYSIS OF ADHESION: SHX5961

## 2019-12-28 LAB — TYPE AND SCREEN
ABO/RH(D): O POS
Antibody Screen: NEGATIVE

## 2019-12-28 LAB — ABO/RH: ABO/RH(D): O POS

## 2019-12-28 LAB — SURGICAL PCR SCREEN
MRSA, PCR: NEGATIVE
Staphylococcus aureus: NEGATIVE

## 2019-12-28 SURGERY — LAPAROTOMY, EXPLORATORY
Anesthesia: General | Site: Abdomen

## 2019-12-28 MED ORDER — DEXAMETHASONE SODIUM PHOSPHATE 10 MG/ML IJ SOLN
INTRAMUSCULAR | Status: AC
  Filled 2019-12-28: qty 2

## 2019-12-28 MED ORDER — ROCURONIUM BROMIDE 10 MG/ML (PF) SYRINGE
PREFILLED_SYRINGE | INTRAVENOUS | Status: DC | PRN
  Administered 2019-12-28: 70 mg via INTRAVENOUS

## 2019-12-28 MED ORDER — HYDROMORPHONE HCL 1 MG/ML IJ SOLN
0.2500 mg | INTRAMUSCULAR | Status: DC | PRN
  Administered 2019-12-28 (×4): 0.5 mg via INTRAVENOUS

## 2019-12-28 MED ORDER — CEFAZOLIN SODIUM-DEXTROSE 2-4 GM/100ML-% IV SOLN
2.0000 g | Freq: Once | INTRAVENOUS | Status: AC
  Administered 2019-12-28: 2 g via INTRAVENOUS
  Filled 2019-12-28: qty 100

## 2019-12-28 MED ORDER — SUCCINYLCHOLINE CHLORIDE 200 MG/10ML IV SOSY
PREFILLED_SYRINGE | INTRAVENOUS | Status: AC
  Filled 2019-12-28: qty 20

## 2019-12-28 MED ORDER — LIDOCAINE 2% (20 MG/ML) 5 ML SYRINGE
INTRAMUSCULAR | Status: DC | PRN
  Administered 2019-12-28: 80 mg via INTRAVENOUS

## 2019-12-28 MED ORDER — MIDAZOLAM HCL 2 MG/2ML IJ SOLN
INTRAMUSCULAR | Status: AC
  Filled 2019-12-28: qty 2

## 2019-12-28 MED ORDER — KETOROLAC TROMETHAMINE 30 MG/ML IJ SOLN
30.0000 mg | Freq: Once | INTRAMUSCULAR | Status: AC | PRN
  Administered 2019-12-28: 14:00:00 30 mg via INTRAVENOUS

## 2019-12-28 MED ORDER — HYDROMORPHONE HCL 1 MG/ML IJ SOLN
INTRAMUSCULAR | Status: AC
  Filled 2019-12-28: qty 1

## 2019-12-28 MED ORDER — SUGAMMADEX SODIUM 200 MG/2ML IV SOLN
INTRAVENOUS | Status: DC | PRN
  Administered 2019-12-28: 160 mg via INTRAVENOUS

## 2019-12-28 MED ORDER — SUCCINYLCHOLINE CHLORIDE 20 MG/ML IJ SOLN
INTRAMUSCULAR | Status: DC | PRN
  Administered 2019-12-28: 120 mg via INTRAVENOUS

## 2019-12-28 MED ORDER — KETOROLAC TROMETHAMINE 30 MG/ML IJ SOLN
INTRAMUSCULAR | Status: AC
  Filled 2019-12-28: qty 1

## 2019-12-28 MED ORDER — 0.9 % SODIUM CHLORIDE (POUR BTL) OPTIME
TOPICAL | Status: DC | PRN
  Administered 2019-12-28: 2000 mL

## 2019-12-28 MED ORDER — DEXAMETHASONE SODIUM PHOSPHATE 10 MG/ML IJ SOLN
INTRAMUSCULAR | Status: DC | PRN
  Administered 2019-12-28: 10 mg via INTRAVENOUS

## 2019-12-28 MED ORDER — MIDAZOLAM HCL 2 MG/2ML IJ SOLN
INTRAMUSCULAR | Status: DC | PRN
  Administered 2019-12-28: 2 mg via INTRAVENOUS

## 2019-12-28 MED ORDER — EPHEDRINE 5 MG/ML INJ
INTRAVENOUS | Status: AC
  Filled 2019-12-28: qty 10

## 2019-12-28 MED ORDER — ACETAMINOPHEN 10 MG/ML IV SOLN
INTRAVENOUS | Status: DC | PRN
  Administered 2019-12-28: 1000 mg via INTRAVENOUS

## 2019-12-28 MED ORDER — PROMETHAZINE HCL 25 MG/ML IJ SOLN: 6.2500 mg | INTRAMUSCULAR | Status: DC | PRN

## 2019-12-28 MED ORDER — HYDROMORPHONE HCL 1 MG/ML IJ SOLN
0.5000 mg | INTRAMUSCULAR | Status: DC | PRN
  Administered 2019-12-28: 0.5 mg via INTRAVENOUS
  Filled 2019-12-28: qty 1

## 2019-12-28 MED ORDER — ONDANSETRON HCL 4 MG/2ML IJ SOLN
INTRAMUSCULAR | Status: AC
  Filled 2019-12-28: qty 4

## 2019-12-28 MED ORDER — FENTANYL CITRATE (PF) 250 MCG/5ML IJ SOLN
INTRAMUSCULAR | Status: AC
  Filled 2019-12-28: qty 5

## 2019-12-28 MED ORDER — ACETAMINOPHEN 10 MG/ML IV SOLN
INTRAVENOUS | Status: AC
  Filled 2019-12-28: qty 100

## 2019-12-28 MED ORDER — PROPOFOL 10 MG/ML IV BOLUS
INTRAVENOUS | Status: AC
  Filled 2019-12-28: qty 20

## 2019-12-28 MED ORDER — FENTANYL CITRATE (PF) 250 MCG/5ML IJ SOLN
INTRAMUSCULAR | Status: DC | PRN
  Administered 2019-12-28: 50 ug via INTRAVENOUS
  Administered 2019-12-28: 100 ug via INTRAVENOUS
  Administered 2019-12-28 (×2): 50 ug via INTRAVENOUS

## 2019-12-28 MED ORDER — ROCURONIUM BROMIDE 10 MG/ML (PF) SYRINGE
PREFILLED_SYRINGE | INTRAVENOUS | Status: AC
  Filled 2019-12-28: qty 10

## 2019-12-28 MED ORDER — PHENYLEPHRINE 40 MCG/ML (10ML) SYRINGE FOR IV PUSH (FOR BLOOD PRESSURE SUPPORT)
PREFILLED_SYRINGE | INTRAVENOUS | Status: AC
  Filled 2019-12-28: qty 20

## 2019-12-28 MED ORDER — ONDANSETRON HCL 4 MG/2ML IJ SOLN
INTRAMUSCULAR | Status: DC | PRN
  Administered 2019-12-28: 4 mg via INTRAVENOUS

## 2019-12-28 MED ORDER — HYDROMORPHONE HCL 1 MG/ML IJ SOLN
1.0000 mg | INTRAMUSCULAR | Status: DC | PRN
  Administered 2019-12-28 – 2019-12-31 (×18): 1 mg via INTRAVENOUS
  Filled 2019-12-28 (×18): qty 1

## 2019-12-28 MED ORDER — LIDOCAINE 2% (20 MG/ML) 5 ML SYRINGE
INTRAMUSCULAR | Status: AC
  Filled 2019-12-28: qty 10

## 2019-12-28 MED ORDER — PROPOFOL 10 MG/ML IV BOLUS
INTRAVENOUS | Status: DC | PRN
  Administered 2019-12-28: 120 mg via INTRAVENOUS

## 2019-12-28 MED ORDER — ACETAMINOPHEN 650 MG RE SUPP
325.0000 mg | RECTAL | Status: DC | PRN
  Filled 2019-12-28: qty 1

## 2019-12-28 SURGICAL SUPPLY — 43 items
BLADE CLIPPER SURG (BLADE) IMPLANT
CANISTER SUCT 3000ML PPV (MISCELLANEOUS) ×3 IMPLANT
CHLORAPREP W/TINT 26 (MISCELLANEOUS) ×3 IMPLANT
COVER SURGICAL LIGHT HANDLE (MISCELLANEOUS) ×3 IMPLANT
COVER WAND RF STERILE (DRAPES) ×3 IMPLANT
DRAPE LAPAROSCOPIC ABDOMINAL (DRAPES) ×3 IMPLANT
DRAPE WARM FLUID 44X44 (DRAPES) ×3 IMPLANT
DRSG OPSITE POSTOP 4X10 (GAUZE/BANDAGES/DRESSINGS) IMPLANT
DRSG OPSITE POSTOP 4X8 (GAUZE/BANDAGES/DRESSINGS) ×3 IMPLANT
ELECT BLADE 6.5 EXT (BLADE) IMPLANT
ELECT CAUTERY BLADE 6.4 (BLADE) ×3 IMPLANT
ELECT REM PT RETURN 9FT ADLT (ELECTROSURGICAL) ×3
ELECTRODE REM PT RTRN 9FT ADLT (ELECTROSURGICAL) ×1 IMPLANT
GLOVE BIO SURGEON STRL SZ8 (GLOVE) ×3 IMPLANT
GLOVE BIOGEL PI IND STRL 8 (GLOVE) ×1 IMPLANT
GLOVE BIOGEL PI INDICATOR 8 (GLOVE) ×2
GOWN STRL REUS W/ TWL LRG LVL3 (GOWN DISPOSABLE) ×2 IMPLANT
GOWN STRL REUS W/ TWL XL LVL3 (GOWN DISPOSABLE) ×1 IMPLANT
GOWN STRL REUS W/TWL LRG LVL3 (GOWN DISPOSABLE) ×4
GOWN STRL REUS W/TWL XL LVL3 (GOWN DISPOSABLE) ×2
HANDLE SUCTION POOLE (INSTRUMENTS) ×1 IMPLANT
KIT BASIN OR (CUSTOM PROCEDURE TRAY) ×3 IMPLANT
KIT TURNOVER KIT B (KITS) ×3 IMPLANT
LIGASURE IMPACT 36 18CM CVD LR (INSTRUMENTS) ×3 IMPLANT
NS IRRIG 1000ML POUR BTL (IV SOLUTION) ×6 IMPLANT
PACK GENERAL/GYN (CUSTOM PROCEDURE TRAY) ×3 IMPLANT
PAD ARMBOARD 7.5X6 YLW CONV (MISCELLANEOUS) ×3 IMPLANT
PENCIL SMOKE EVACUATOR (MISCELLANEOUS) ×3 IMPLANT
RELOAD PROXIMATE 75MM BLUE (ENDOMECHANICALS) ×6 IMPLANT
SPECIMEN JAR LARGE (MISCELLANEOUS) IMPLANT
SPONGE LAP 18X18 RF (DISPOSABLE) IMPLANT
STAPLER GUN LINEAR PROX 60 (STAPLE) ×3 IMPLANT
STAPLER PROXIMATE 75MM BLUE (STAPLE) ×3 IMPLANT
STAPLER VISISTAT 35W (STAPLE) ×3 IMPLANT
SUCTION POOLE HANDLE (INSTRUMENTS) ×3
SUT PDS AB 1 TP1 96 (SUTURE) ×6 IMPLANT
SUT SILK 2 0 SH CR/8 (SUTURE) ×3 IMPLANT
SUT SILK 2 0 TIES 10X30 (SUTURE) ×3 IMPLANT
SUT SILK 3 0 SH CR/8 (SUTURE) ×3 IMPLANT
SUT SILK 3 0 TIES 10X30 (SUTURE) ×3 IMPLANT
TOWEL GREEN STERILE (TOWEL DISPOSABLE) ×3 IMPLANT
TRAY FOLEY MTR SLVR 16FR STAT (SET/KITS/TRAYS/PACK) IMPLANT
YANKAUER SUCT BULB TIP NO VENT (SUCTIONS) IMPLANT

## 2019-12-28 NOTE — Progress Notes (Addendum)
Pt is A&O x4, [employer] interpreter is used for assessment. Dr Grandville Silos came in, pt will have surgery today. NPO maint. Consent obtained.  Report was given to Sam at Short stay. 1220 Pt to short stay via bed. 1540 Received pt back from PACU, A&O x4. Mid lower abd incision with honeycomb dressing dry and intact. NGT to low intermittent suction, with greenish output noted. Medicated fo pain. Pt's daughter at the bedside, speaks fluent [place], interprets for the pt.

## 2019-12-28 NOTE — Anesthesia Procedure Notes (Signed)
Procedure Name: Intubation Date/Time: 12/28/2019 1:23 PM Performed by: Larene Beach, CRNA Pre-anesthesia Checklist: Patient identified, Emergency Drugs available, Suction available and Patient being monitored Patient Re-evaluated:Patient Re-evaluated prior to induction Oxygen Delivery Method: Circle system utilized Preoxygenation: Pre-oxygenation with 100% oxygen Induction Type: IV induction, Rapid sequence and Cricoid Pressure applied Laryngoscope Size: Mac and 3 Grade View: Grade II Tube type: Oral Tube size: 7.0 mm Number of attempts: 1 Airway Equipment and Method: Stylet and Oral airway Placement Confirmation: ETT inserted through vocal cords under direct vision,  positive ETCO2 and breath sounds checked- equal and bilateral Secured at: 21 cm Tube secured with: Tape Dental Injury: Teeth and Oropharynx as per pre-operative assessment

## 2019-12-28 NOTE — Plan of Care (Signed)

## 2019-12-28 NOTE — Progress Notes (Signed)
Pt was able to get rest with the phenergan and dilaudid. Pt currently NPO according to handoff report from day shift, but orders say other wise.

## 2019-12-28 NOTE — Progress Notes (Signed)
Patient ID: Linda Vang, female   DOB: December 28, 1981, 38 y.o.   MRN: [id number] Patient was admitted with SBO 12/25. The SBO protocol was done but contrast has not progressed. I reviewed her films. She failed a trial of clears yesterday and vomited. She has some tenderness today as well. I agree with Dr. Marcello Moores. Will proceed with ex lap, possible bowel resection for SBO. She agrees. Consent done.  Georganna Skeans, MD, MPH, FACS Please use [email or web address] to contact on call provider

## 2019-12-28 NOTE — Plan of Care (Signed)

## 2019-12-28 NOTE — Anesthesia Postprocedure Evaluation (Signed)
Anesthesia Post Note  Patient: Linda Vang  Procedure(s) Performed: EXPLORATORY LAPAROTOMY (N/A Abdomen) Lysis Of Adhesion (N/A Abdomen) Small Bowel Resection (N/A Abdomen)     Anesthesia Post Evaluation  Last Vitals:  Vitals:   12/28/19 0843 12/28/19 1415  BP: 111/71 139/86  Pulse: 77 98  Resp: 18 16  Temp: 37.8 C 37.3 C  SpO2: 98% 100%    Last Pain:  Vitals:   12/28/19 1426  TempSrc:   PainSc: 9                  Kasch Borquez S

## 2019-12-28 NOTE — Progress Notes (Signed)
Subjective: CC: Spanish interpreter used Patient reports increased abdominal pain, bloating and nausea after removing NG yesterday.  Multiple episodes of emesis Objective: Vital signs in last 24 hours: Temp:  [98.2 F (36.8 C)-100 F (37.8 C)] 100 F (37.8 C) (12/31 0843) Pulse Rate:  [77-88] 77 (12/31 0843) Resp:  [17-18] 18 (12/31 0843) BP: (111-121)/(71-89) 111/71 (12/31 0843) SpO2:  [97 %-99 %] 98 % (12/31 0843) Last BM Date: 12/27/19  Intake/Output from previous day: 12/30 0701 - 12/31 0700 In: -  Out: 300 [Emesis/NG output:300] Intake/Output this shift: No intake/output data recorded.  PE: Gen:  Alert, NAD, pleasant Pulm: Normal rate and effort  Abd: Soft, distended  Ext:  No LE edema  Psych: A&Ox3  Skin: no rashes noted, warm and dry  Lab Results:  Recent Labs    12/27/19 0527  WBC 5.8  HGB 10.6*  HCT 33.1*  PLT 257   BMET Recent Labs    12/26/19 1121 12/27/19 0527  NA 139 137  K 3.2* 3.8  CL 103 104  CO2 24 21*  GLUCOSE 79 82  BUN 11 9  CREATININE 0.70 0.61  CALCIUM 8.7* 8.6*   PT/INR No results for input(s): LABPROT, INR in the last 72 hours. CMP     Component Value Date/Time   NA 137 12/27/2019 0527   K 3.8 12/27/2019 0527   CL 104 12/27/2019 0527   CO2 21 (L) 12/27/2019 0527   GLUCOSE 82 12/27/2019 0527   BUN 9 12/27/2019 0527   CREATININE 0.61 12/27/2019 0527   CALCIUM 8.6 (L) 12/27/2019 0527   PROT 7.5 12/23/2019 1742   ALBUMIN 4.0 12/23/2019 1742   AST 18 12/23/2019 1742   ALT 14 12/23/2019 1742   ALKPHOS 67 12/23/2019 1742   BILITOT 1.2 12/23/2019 1742   GFRNONAA >60 12/27/2019 0527   GFRAA >60 12/27/2019 0527   Lipase     Component Value Date/Time   LIPASE 24 12/23/2019 1742       Studies/Results: DG Abd 2 Views  Result Date: 12/28/2019 CLINICAL DATA:  Abdominal pain, vomiting EXAM: ABDOMEN - 2 VIEW COMPARISON:  12/27/2019 FINDINGS: Dilated small bowel loops with air-fluid levels. Overall gaseous  distention of small bowel slightly increased since prior study. Prior cholecystectomy. Gas and stool seen within decompressed colon. No organomegaly. Lung bases clear. IMPRESSION: Dilated small bowel loops with air-fluid levels, slightly worsening since prior study. Findings compatible with small bowel obstruction. Electronically Signed   By: Rolm Baptise M.D.   On: 12/28/2019 09:50   DG Abd Portable 1V  Result Date: 12/27/2019 CLINICAL DATA:  Small bowel obstruction. EXAM: PORTABLE ABDOMEN - 1 VIEW COMPARISON:  Multiple prior radiographs most recently yesterday. CT 12/23/2019 FINDINGS: Persistent gaseous distention of small bowel in the central abdomen, equivocal improvement from yesterday. Persistent small bowel dilatation to 4.2 cm. Small volume of stool in the hepatic flexure of the colon. No gross evidence of free air. IMPRESSION: Persistent small bowel distension centrally, with equivocal improvement from yesterday. Electronically Signed   By: Keith Rake M.D.   On: 12/27/2019 06:00    Anti-infectives: Anti-infectives (From admission, onward)   None       Assessment/Plan SBO vs enteritis w/ ileus  - Hx of C-Sections x 3 and GB in the past - SBO protocol done x 2. Todays AM xray looks worse than yesterday - failed protocol.  Surgery recommended.  Will discuss with on call surgeon  FEN - NPO, IVF VTE -  SCDs, Lovenox ID - None   LOS: 5 days    Vanita Panda , MD [facility] Surgery 12/28/2019, 10:57 AM Please see Amion for pager number during day hours 7:00am-4:30pm

## 2019-12-28 NOTE — Anesthesia Preprocedure Evaluation (Signed)
Anesthesia Evaluation  Patient identified by MRN, date of birth, ID band Patient awake    Reviewed: Allergy & Precautions, NPO status , Patient's Chart, lab work & pertinent test results  Airway Mallampati: II  TM Distance: >3 FB Neck ROM: Full    Dental no notable dental hx.    Pulmonary neg pulmonary ROS,    Pulmonary exam normal breath sounds clear to auscultation       Cardiovascular negative cardio ROS Normal cardiovascular exam Rhythm:Regular Rate:Normal     Neuro/Psych negative neurological ROS  negative psych ROS   GI/Hepatic negative GI ROS, Neg liver ROS,   Endo/Other  negative endocrine ROS  Renal/GU negative Renal ROS  negative genitourinary   Musculoskeletal negative musculoskeletal ROS (+)   Abdominal   Peds negative pediatric ROS (+)  Hematology negative hematology ROS (+)   Anesthesia Other Findings   Reproductive/Obstetrics negative OB ROS                             Anesthesia Physical Anesthesia Plan  ASA: II  Anesthesia Plan: General   Post-op Pain Management:    Induction: Intravenous and Rapid sequence  PONV Risk Score and Plan: 3 and Ondansetron, Dexamethasone and Treatment may vary due to age or medical condition  Airway Management Planned: Oral ETT  Additional Equipment:   Intra-op Plan:   Post-operative Plan: Extubation in OR  Informed Consent: I have reviewed the patients History and Physical, chart, labs and discussed the procedure including the risks, benefits and alternatives for the proposed anesthesia with the patient or authorized representative who has indicated his/her understanding and acceptance.     Dental advisory given  Plan Discussed with: CRNA and Surgeon  Anesthesia Plan Comments:         Anesthesia Quick Evaluation  

## 2019-12-28 NOTE — Transfer of Care (Signed)
Immediate Anesthesia Transfer of Care Note  Patient: Linda Vang  Procedure(s) Performed: EXPLORATORY LAPAROTOMY (N/A Abdomen) Lysis Of Adhesion (N/A Abdomen) Small Bowel Resection (N/A Abdomen)  Patient Location: PACU  Anesthesia Type:General  Level of Consciousness: drowsy and patient cooperative  Airway & Oxygen Therapy: Patient Spontanous Breathing  Post-op Assessment: Report given to RN, Post -op Vital signs reviewed and stable and Patient moving all extremities X 4  Post vital signs: Reviewed and stable  Last Vitals:  Vitals Value Taken Time  BP 139/86 12/28/19 1414  Temp    Pulse 98 12/28/19 1415  Resp 16 12/28/19 1415  SpO2 100 % 12/28/19 1415  Vitals shown include unvalidated device data.  Last Pain:  Vitals:   12/28/19 0959  TempSrc:   PainSc: 4       Patients Stated Pain Goal: 3 (38/18/29 9371)  Complications: No apparent anesthesia complications

## 2019-12-28 NOTE — Op Note (Signed)
°  12/28/2019  2:09 PM  PATIENT:  Linda Vang  38 y.o. 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  She was brought to the operating room and general endotracheal anesthesia was administered by the anesthesia staff.  Foley catheter was placed by nursing.  She received intravenous antibiotics.  Her abdomen was prepped and draped in sterile fashion.  Timeout procedure was performed.  A midline incision was made.  Subcutaneous tissues were dissected down revealing the anterior fascia.  This was divided sharply along the midline and the peritoneal cavity was entered carefully.  I gradually opened the fascia to the length of the incision freeing up omental adhesions along the way.  I then delivered multiple dilated loops of small bowel up into the wound.  There was a clear area of near closed-loop obstruction with 2 very strictured areas of distal ileum caused by adhesions from the omentum.  I divided the adhesions, when I divided the more distal one, there was a small hole in the small bowel where it had gotten very thin.  I temporarily closed this hole with a silk suture.  I then ran the bowel and there were no further obstructions down to the terminal ileum.  I ran the bowel backwards and while it was dilated there were no other  obstructions or adhesions going back up to the ligament of Treitz.  I returned my attention to the 2 strictured areas.  I divided the small bowel proximal and distal to these areas with GIA-75 stapler.  I took the mesentery down with LigaSure.  I made a side-to-side anastomosis with a GIA-75 stapler and closed the common defect with a TA 60.  Apical sutures of 3-0 silk x2 were placed.  The anastomosis was widely patent.  I placed a few 3 oh silks along the staple line to get good hemostasis.  I closed the defect in the mesentery with interrupted 2-0 silk sutures.  I then changed my gloves.  The abdomen was copiously irrigated with warm saline.  The anastomosis was rechecked.  It was patent and there was no leak.  Hemostasis was good.  I placed the bowel back in anatomic position and brought the omentum over the bowel.  NG tube was confirmed in position.  The fascia was closed with running #1 looped PDS.  The subcutaneous tissues were irrigated and the skin was closed with staples.  A sterile dressing was applied.  She tolerated the procedure well without apparent complication was taken recovery in stable condition.  All counts were correct. PATIENT DISPOSITION:  PACU - hemodynamically stable.   Delay start of Pharmacological VTE agent (>24hrs) due to surgical blood loss or risk of bleeding:  no  Georganna Skeans, MD, MPH, FACS Pager: [phone]  12/31/20202:09 PM

## 2019-12-29 NOTE — Progress Notes (Deleted)
Pt c/o severe throat soreness d/t NG tube. Pt NPO with ice chips, I believe pt is using throat spray at the bedside, but not relief. Notified Dr. Violeta Gelinas. Verbal order for lozengers PRN, qhr.

## 2019-12-29 NOTE — Progress Notes (Signed)
1 Day Post-Op   Subjective/Chief Complaint: Pain controlled, sore throat from ng. No flatus yet, denies bowel movement though 600 of stool is recorded    Objective: Vital signs in last 24 hours: Temp:  [97.5 F (36.4 C)-99.1 F (37.3 C)] 97.6 F (36.4 C) (01/01 0800) Pulse Rate:  [80-108] 108 (01/01 0800) Resp:  [14-19] 15 (12/31 1539) BP: (107-139)/(68-86) 114/71 (01/01 0800) SpO2:  [94 %-100 %] 98 % (01/01 0800) Last BM Date: 12/20/19  Intake/Output from previous day: 12/31 0701 - 01/01 0700 In: 950 [I.V.:750; IV Piggyback:200] Out: 1910 [Urine:1250; Emesis/NG output:50; Stool:600; Blood:10] Intake/Output this shift: No intake/output data recorded.  General appearance: alert Resp: unlabored GI: soft, nondistended, appropriately tender  Lab Results:  Recent Labs    12/27/19 0527  WBC 5.8  HGB 10.6*  HCT 33.1*  PLT 257   BMET Recent Labs    12/26/19 1121 12/27/19 0527  NA 139 137  K 3.2* 3.8  CL 103 104  CO2 24 21*  GLUCOSE 79 82  BUN 11 9  CREATININE 0.70 0.61  CALCIUM 8.7* 8.6*   PT/INR No results for input(s): LABPROT, INR in the last 72 hours. ABG No results for input(s): PHART, HCO3 in the last 72 hours.  Invalid input(s): PCO2, PO2  Studies/Results: DG Abd 2 Views  Result Date: 12/28/2019 CLINICAL DATA:  Abdominal pain, vomiting EXAM: ABDOMEN - 2 VIEW COMPARISON:  12/27/2019 FINDINGS: Dilated small bowel loops with air-fluid levels. Overall gaseous distention of small bowel slightly increased since prior study. Prior cholecystectomy. Gas and stool seen within decompressed colon. No organomegaly. Lung bases clear. IMPRESSION: Dilated small bowel loops with air-fluid levels, slightly worsening since prior study. Findings compatible with small bowel obstruction. Electronically Signed   By: Charlett Nose M.D.   On: 12/28/2019 09:50    Anti-infectives: Anti-infectives (From admission, onward)   Start     Dose/Rate Route Frequency Ordered Stop   12/28/19 1245  ceFAZolin (ANCEF) IVPB 2g/100 mL premix     2 g 200 mL/hr over 30 Minutes Intravenous  Once 12/28/19 1243 12/28/19 1315      Assessment/Plan: s/p Procedure(s): EXPLORATORY LAPAROTOMY (N/A) Lysis Of Adhesion (N/A) Small Bowel Resection (N/A) Ambulate pulm toilet Await bowel function   LOS: 6 days    Berna Bue 12/29/2019

## 2019-12-30 LAB — BASIC METABOLIC PANEL
Anion gap: 11 (ref 5–15)
BUN: <5 mg/dL — ABNORMAL LOW (ref 6–20)
CO2: 25 mmol/L (ref 22–32)
Calcium: 8.2 mg/dL — ABNORMAL LOW (ref 8.9–10.3)
Chloride: 101 mmol/L (ref 98–111)
Creatinine, Ser: 0.62 mg/dL (ref 0.44–1.00)
GFR calc Af Amer: >60 mL/min (ref >60)
GFR calc non Af Amer: >60 mL/min (ref >60)
Glucose, Bld: 90 mg/dL (ref 70–99)
Potassium: 2.9 mmol/L — ABNORMAL LOW (ref 3.5–5.1)
Sodium: 137 mmol/L (ref 135–145)

## 2019-12-30 LAB — CBC
HCT: 31 % — ABNORMAL LOW (ref 36.0–46.0)
Hemoglobin: 10.2 g/dL — ABNORMAL LOW (ref 12.0–15.0)
MCH: 25.6 pg — ABNORMAL LOW (ref 26.0–34.0)
MCHC: 32.9 g/dL (ref 30.0–36.0)
MCV: 77.9 fL — ABNORMAL LOW (ref 80.0–100.0)
Platelets: 265 10*3/uL (ref 150–400)
RBC: 3.98 MIL/uL (ref 3.87–5.11)
RDW: 16.7 % — ABNORMAL HIGH (ref 11.5–15.5)
WBC: 9.9 10*3/uL (ref 4.0–10.5)
nRBC: 0 % (ref 0.0–0.2)

## 2019-12-30 LAB — MAGNESIUM: Magnesium: 1.8 mg/dL (ref 1.7–2.4)

## 2019-12-30 MED ORDER — ALUM & MAG HYDROXIDE-SIMETH 200-200-20 MG/5ML PO SUSP
30.0000 mL | ORAL | Status: DC | PRN
  Administered 2019-12-30 – 2020-01-02 (×5): 30 mL via ORAL
  Filled 2019-12-30 (×5): qty 30

## 2019-12-30 MED ORDER — PANTOPRAZOLE SODIUM 40 MG PO TBEC
40.0000 mg | DELAYED_RELEASE_TABLET | Freq: Every day | ORAL | Status: DC
  Administered 2019-12-30 – 2020-01-03 (×5): 40 mg via ORAL
  Filled 2019-12-30 (×5): qty 1

## 2019-12-30 MED ORDER — POTASSIUM CHLORIDE 10 MEQ/100ML IV SOLN
10.0000 meq | INTRAVENOUS | Status: AC
  Administered 2019-12-30 (×4): 10 meq via INTRAVENOUS
  Filled 2019-12-30 (×4): qty 100

## 2019-12-30 MED ORDER — TRAZODONE HCL 50 MG PO TABS
50.0000 mg | ORAL_TABLET | Freq: Every evening | ORAL | Status: DC | PRN
  Administered 2019-12-30: 50 mg via ORAL
  Filled 2019-12-30: qty 1

## 2019-12-30 NOTE — Significant Event (Signed)
Rapid Response Event Note  Overview: Called d/t MEWS-3 for T-100.5 and HR-117. This is not an acute change. MD already called and informed. Please call RRT if assistance needed.      Terrilyn Saver

## 2019-12-30 NOTE — Significant Event (Signed)
Rapid Response Event Note  Overview:  Called d/t MEWS-4. T-102.5, HR-118-other VS and neuro status WNL. Tylenol given previously at 2014 for T-101.7. Tylenol order is q6hprn so another dose isn't due until 0214. Ice packs are an option if pt can tolerate. RN to contact MD for further orders. Please call RRT if assistance needed.        Terrilyn Saver

## 2019-12-30 NOTE — Progress Notes (Signed)
Patient ID: Linda Vang, female   DOB: Aug 24, 1981, 39 y.o.   MRN: [id number]   Acute Care Surgery Service Progress Note:    Chief Complaint/Subjective: Febrile to 102 last pm Video interpreter used No flatus/bm Spent time in chair yesterday Worked on IS yesterday C/o sore throat and abd pain Min ng output  Objective: Vital signs in last 24 hours: Temp:  [98.8 F (37.1 C)-102.5 F (39.2 C)] 99.6 F (37.6 C) (01/02 0725) Pulse Rate:  [105-121] 115 (01/02 0725) Resp:  [14-18] 15 (01/02 0440) BP: (98-121)/(58-74) 108/71 (01/02 0725) SpO2:  [95 %-99 %] 97 % (01/02 0725) Last BM Date: 12/28/19  Intake/Output from previous day: 01/01 0701 - 01/02 0700 In: 0  Out: 1350 [Urine:1350] Intake/Output this shift: No intake/output data recorded.  Lungs: cta, nonlabored  Cardiovascular: reg  Abd: soft, mild distension, approp TTP, incision ok. Few BS  Extremities: no edema, +SCDs  Neuro: alert, nonfocal  Lab Results: CBC  No results for input(s): WBC, HGB, HCT, PLT in the last 72 hours. BMET No results for input(s): NA, K, CL, CO2, GLUCOSE, BUN, CREATININE, CALCIUM in the last 72 hours. LFT Hepatic Function Latest Ref Rng & Units 12/23/2019 12/22/2019  Total Protein 6.5 - 8.1 g/dL 7.5 7.4  Albumin 3.5 - 5.0 g/dL 4.0 4.1  AST 15 - 41 U/L 18 18  ALT 0 - 44 U/L 14 14  Alk Phosphatase 38 - 126 U/L 67 69  Total Bilirubin 0.3 - 1.2 mg/dL 1.2 1.1   PT/INR No results for input(s): LABPROT, INR in the last 72 hours. ABG No results for input(s): PHART, HCO3 in the last 72 hours.  Invalid input(s): PCO2, PO2  Studies/Results:  Anti-infectives: Anti-infectives (From admission, onward)   Start     Dose/Rate Route Frequency Ordered Stop   12/28/19 1245  ceFAZolin (ANCEF) IVPB 2g/100 mL premix     2 g 200 mL/hr over 30 Minutes Intravenous  Once 12/28/19 1243 12/28/19 1315      Medications: Scheduled Meds: . acetaminophen  1,000 mg Oral Q6H  . enoxaparin  (LOVENOX) injection  40 mg Subcutaneous Q24H   Continuous Infusions: . lactated ringers 100 mL/hr at 12/30/19 0900   PRN Meds:.HYDROmorphone (DILAUDID) injection, HYDROmorphone (DILAUDID) injection, menthol-cetylpyridinium, methocarbamol, ondansetron **OR** ondansetron (ZOFRAN) IV, phenol, promethazine, simethicone, traZODone  Assessment/Plan: Patient Active Problem List   Diagnosis Date Noted  . SBO (small bowel obstruction) ([place]) 12/23/2019   s/p Procedure(s): EXPLORATORY LAPAROTOMY Lysis Of Adhesion Small Bowel Resection 12/28/2019 by Dr Grandville Silos  D/c ng tube- minimal output Sips Await return of bowel function pulm toilet, is, ambulate Check labs Cont chemical vte prophylaxis   Disposition:  LOS: 7 days    Leighton Ruff. Redmond Pulling, MD, FACS General, Bariatric, & Minimally Invasive Surgery [phone] [facility]

## 2019-12-30 NOTE — Progress Notes (Signed)
Rapid Response Note  Called at 18:33 by primary RN for patient having a red MEWS of 4. Pt febrile at 102.55F and HR is 124. Primary RN states that she does not need rapid response at the bedside, at this time. She states the patient had a similar event happen last night. RN believes tachycardia to be related to the patient's pain and fever. RN has treated both fever and pain the with PRN medications at 18:20. RN states patient is not in acute distress. RN encouraged to call rapid response back for acute change or if patient remains a red MEWS despite resolution of fever and decreased pain.

## 2019-12-30 NOTE — Progress Notes (Signed)
2000 - Pt MEWS score is 3 with temperature of 101.7 HR 105. Tylenol 1000 mg oral and dilaudid 1 mg IV given for pain and fever. Dr. Fredricka Bonine notified. No new orders at this time. Pt pulling 1000 ml on IS. Pt vital signs checks q2hx2. 0027 - Pt with no changes in condition. Temperature went up to 102.5 and HR 118. RRT informed and Dr. Fredricka Bonine notified. Ordered to closely monitor patient. TSB provided. Pt encouraged to do IS. Will monitor pt frequently. 1021 - Pt temperature went down to 98.9 and HR 112 after tylenol was given. Pt in no distress, resting on bed and doing IS. Also, voiding good in BSC several times. Will continue to monitor.

## 2019-12-31 ENCOUNTER — Encounter (HOSPITAL_COMMUNITY): Payer: Self-pay

## 2019-12-31 LAB — BASIC METABOLIC PANEL
Anion gap: 11 (ref 5–15)
BUN: <5 mg/dL — ABNORMAL LOW (ref 6–20)
CO2: 25 mmol/L (ref 22–32)
Calcium: 8 mg/dL — ABNORMAL LOW (ref 8.9–10.3)
Chloride: 100 mmol/L (ref 98–111)
Creatinine, Ser: 0.54 mg/dL (ref 0.44–1.00)
GFR calc Af Amer: >60 mL/min (ref >60)
GFR calc non Af Amer: >60 mL/min (ref >60)
Glucose, Bld: 99 mg/dL (ref 70–99)
Potassium: 3 mmol/L — ABNORMAL LOW (ref 3.5–5.1)
Sodium: 136 mmol/L (ref 135–145)

## 2019-12-31 LAB — CBC
HCT: 28.2 % — ABNORMAL LOW (ref 36.0–46.0)
Hemoglobin: 9 g/dL — ABNORMAL LOW (ref 12.0–15.0)
MCH: 25.3 pg — ABNORMAL LOW (ref 26.0–34.0)
MCHC: 31.9 g/dL (ref 30.0–36.0)
MCV: 79.2 fL — ABNORMAL LOW (ref 80.0–100.0)
Platelets: 247 10*3/uL (ref 150–400)
RBC: 3.56 MIL/uL — ABNORMAL LOW (ref 3.87–5.11)
RDW: 16.8 % — ABNORMAL HIGH (ref 11.5–15.5)
WBC: 7.8 10*3/uL (ref 4.0–10.5)
nRBC: 0 % (ref 0.0–0.2)

## 2019-12-31 MED ORDER — POTASSIUM CHLORIDE 20 MEQ PO PACK
40.0000 meq | PACK | Freq: Once | ORAL | Status: AC
  Administered 2019-12-31: 40 meq via ORAL
  Filled 2019-12-31: qty 2

## 2019-12-31 MED ORDER — IBUPROFEN 200 MG PO TABS
400.0000 mg | ORAL_TABLET | Freq: Four times a day (QID) | ORAL | Status: DC | PRN
  Administered 2020-01-02: 400 mg via ORAL
  Filled 2019-12-31: qty 2

## 2019-12-31 MED ORDER — POTASSIUM CHLORIDE 10 MEQ/100ML IV SOLN
10.0000 meq | INTRAVENOUS | Status: AC
  Administered 2019-12-31 (×6): 10 meq via INTRAVENOUS
  Filled 2019-12-31 (×6): qty 100

## 2019-12-31 MED ORDER — BOOST / RESOURCE BREEZE PO LIQD CUSTOM
1.0000 | Freq: Three times a day (TID) | ORAL | Status: DC
  Administered 2019-12-31 – 2020-01-03 (×10): 1 via ORAL

## 2019-12-31 MED ORDER — OXYCODONE HCL 5 MG PO TABS
5.0000 mg | ORAL_TABLET | Freq: Four times a day (QID) | ORAL | Status: DC | PRN
  Administered 2019-12-31: 5 mg via ORAL
  Filled 2019-12-31 (×2): qty 1

## 2019-12-31 MED ORDER — POTASSIUM CHLORIDE 10 MEQ/100ML IV SOLN: 10.0000 meq | INTRAVENOUS | Status: DC

## 2019-12-31 MED ORDER — HYDROMORPHONE HCL 1 MG/ML IJ SOLN
0.5000 mg | INTRAMUSCULAR | Status: DC | PRN
  Administered 2019-12-31 – 2020-01-02 (×8): 0.5 mg via INTRAVENOUS
  Filled 2019-12-31 (×8): qty 1

## 2019-12-31 NOTE — Progress Notes (Signed)
Patient ID: Linda Vang, female   DOB: 06/04/1981, 39 y.o.   MRN: [id number]   Acute Care Surgery Service Progress Note:    Chief Complaint/Subjective: Passing flatus. Minimal abdominal pain. No nausea, feels hungry.   Objective: Vital signs in last 24 hours: Temp:  [98.6 F (37 C)-102.7 F (39.3 C)] 99.9 F (37.7 C) (01/03 0745) Pulse Rate:  [98-124] 100 (01/03 0745) Resp:  [20] 20 (01/02 1846) BP: (96-130)/(56-81) 130/64 (01/03 0745) SpO2:  [93 %-97 %] 95 % (01/03 0745) Last BM Date: 12/20/19  Intake/Output from previous day: 01/02 0701 - 01/03 0700 In: 6457.8 [P.O.:674; I.V.:5783.8] Out: -  Intake/Output this shift: No intake/output data recorded.  Lungs: nonlabored  Cardiovascular: reg  Abd: soft, minimal distension, minmallt approp TTP, incision ok.   Extremities: no edema, +SCDs  Neuro: alert, nonfocal  Lab Results: CBC  Recent Labs    12/30/19 1030 12/31/19 0317  WBC 9.9 7.8  HGB 10.2* 9.0*  HCT 31.0* 28.2*  PLT 265 247   BMET Recent Labs    12/30/19 1030 12/31/19 0317  NA 137 136  K 2.9* 3.0*  CL 101 100  CO2 25 25  GLUCOSE 90 99  BUN <5* <5*  CREATININE 0.62 0.54  CALCIUM 8.2* 8.0*   LFT Hepatic Function Latest Ref Rng & Units 12/23/2019 12/22/2019  Total Protein 6.5 - 8.1 g/dL 7.5 7.4  Albumin 3.5 - 5.0 g/dL 4.0 4.1  AST 15 - 41 U/L 18 18  ALT 0 - 44 U/L 14 14  Alk Phosphatase 38 - 126 U/L 67 69  Total Bilirubin 0.3 - 1.2 mg/dL 1.2 1.1   PT/INR No results for input(s): LABPROT, INR in the last 72 hours. ABG No results for input(s): PHART, HCO3 in the last 72 hours.  Invalid input(s): PCO2, PO2  Studies/Results:  Anti-infectives: Anti-infectives (From admission, onward)   Start     Dose/Rate Route Frequency Ordered Stop   12/28/19 1245  ceFAZolin (ANCEF) IVPB 2g/100 mL premix     2 g 200 mL/hr over 30 Minutes Intravenous  Once 12/28/19 1243 12/28/19 1315      Medications: Scheduled Meds: . acetaminophen   1,000 mg Oral Q6H  . enoxaparin (LOVENOX) injection  40 mg Subcutaneous Q24H  . pantoprazole  40 mg Oral Daily   Continuous Infusions: . lactated ringers 100 mL/hr at 12/31/19 0400  . potassium chloride     PRN Meds:.alum & mag hydroxide-simeth, HYDROmorphone (DILAUDID) injection, HYDROmorphone (DILAUDID) injection, menthol-cetylpyridinium, methocarbamol, ondansetron **OR** ondansetron (ZOFRAN) IV, phenol, promethazine, simethicone, traZODone  Assessment/Plan: Patient Active Problem List   Diagnosis Date Noted  . SBO (small bowel obstruction) ([place]) 12/23/2019   s/p Procedure(s): EXPLORATORY LAPAROTOMY Lysis Of Adhesion Small Bowel Resection 12/28/2019 by Dr Grandville Silos  Advance to clears- reports flatus Febrile overnight, afebrile this am, WBC normal pulm toilet, is, ambulate Cont chemical vte prophylaxis Hypokalemia- 3.0- replace IV & PO Recheck labs in AM   Disposition:  LOS: 8 days    Clovis Riley MD, FACS General, Bariatric, & Minimally Invasive Surgery [phone] [facility]

## 2020-01-01 LAB — BASIC METABOLIC PANEL
Anion gap: 8 (ref 5–15)
BUN: <5 mg/dL — ABNORMAL LOW (ref 6–20)
CO2: 26 mmol/L (ref 22–32)
Calcium: 8.1 mg/dL — ABNORMAL LOW (ref 8.9–10.3)
Chloride: 103 mmol/L (ref 98–111)
Creatinine, Ser: 0.56 mg/dL (ref 0.44–1.00)
GFR calc Af Amer: >60 mL/min (ref >60)
GFR calc non Af Amer: >60 mL/min (ref >60)
Glucose, Bld: 100 mg/dL — ABNORMAL HIGH (ref 70–99)
Potassium: 3.5 mmol/L (ref 3.5–5.1)
Sodium: 137 mmol/L (ref 135–145)

## 2020-01-01 LAB — CBC
HCT: 27.1 % — ABNORMAL LOW (ref 36.0–46.0)
Hemoglobin: 8.7 g/dL — ABNORMAL LOW (ref 12.0–15.0)
MCH: 25.2 pg — ABNORMAL LOW (ref 26.0–34.0)
MCHC: 32.1 g/dL (ref 30.0–36.0)
MCV: 78.6 fL — ABNORMAL LOW (ref 80.0–100.0)
Platelets: 258 10*3/uL (ref 150–400)
RBC: 3.45 MIL/uL — ABNORMAL LOW (ref 3.87–5.11)
RDW: 16.6 % — ABNORMAL HIGH (ref 11.5–15.5)
WBC: 6.7 10*3/uL (ref 4.0–10.5)
nRBC: 0 % (ref 0.0–0.2)

## 2020-01-01 LAB — SURGICAL PATHOLOGY

## 2020-01-01 LAB — MAGNESIUM: Magnesium: 2 mg/dL (ref 1.7–2.4)

## 2020-01-01 MED ORDER — GUAIFENESIN-DM 100-10 MG/5ML PO SYRP
5.0000 mL | ORAL_SOLUTION | ORAL | Status: DC | PRN
  Administered 2020-01-01 – 2020-01-02 (×2): 5 mL via ORAL
  Filled 2020-01-01 (×2): qty 10

## 2020-01-01 NOTE — Progress Notes (Signed)
Patient ID: Linda Vang, female   DOB: 07-17-1981, 39 y.o.   MRN: [id number]   Acute Care Surgery Service Progress Note:    Chief Complaint/Subjective: Passing lots of flatus. Denies abdominal pain. No nausea, hungry. Asking when she can go home  Objective: Vital signs in last 24 hours: Temp:  [98.2 F (36.8 C)-99.7 F (37.6 C)] 99.3 F (37.4 C) (01/04 0912) Pulse Rate:  [79-110] 89 (01/04 0912) Resp:  [17-18] 17 (01/04 0238) BP: (95-108)/(57-71) 106/63 (01/04 0912) SpO2:  [96 %-98 %] 98 % (01/04 0912) Last BM Date: 12/20/19  Intake/Output from previous day: 01/03 0701 - 01/04 0700 In: 1117 [P.O.:474; IV Piggyback:643] Out: -  Intake/Output this shift: No intake/output data recorded.  Lungs: nonlabored  Cardiovascular: reg  Abd: soft, nondistended, nontender; incision ok without erythema - honeycomb in place  Extremities: no edema, +SCDs  Neuro: alert, nonfocal  Lab Results: CBC  Recent Labs    12/31/19 0317 01/01/20 0316  WBC 7.8 6.7  HGB 9.0* 8.7*  HCT 28.2* 27.1*  PLT 247 258   BMET Recent Labs    12/31/19 0317 01/01/20 0316  NA 136 137  K 3.0* 3.5  CL 100 103  CO2 25 26  GLUCOSE 99 100*  BUN <5* <5*  CREATININE 0.54 0.56  CALCIUM 8.0* 8.1*   LFT Hepatic Function Latest Ref Rng & Units 12/23/2019 12/22/2019  Total Protein 6.5 - 8.1 g/dL 7.5 7.4  Albumin 3.5 - 5.0 g/dL 4.0 4.1  AST 15 - 41 U/L 18 18  ALT 0 - 44 U/L 14 14  Alk Phosphatase 38 - 126 U/L 67 69  Total Bilirubin 0.3 - 1.2 mg/dL 1.2 1.1   PT/INR No results for input(s): LABPROT, INR in the last 72 hours. ABG No results for input(s): PHART, HCO3 in the last 72 hours.  Invalid input(s): PCO2, PO2  Studies/Results:  Anti-infectives: Anti-infectives (From admission, onward)   Start     Dose/Rate Route Frequency Ordered Stop   12/28/19 1245  ceFAZolin (ANCEF) IVPB 2g/100 mL premix     2 g 200 mL/hr over 30 Minutes Intravenous  Once 12/28/19 1243 12/28/19 1315       Medications: Scheduled Meds: . acetaminophen  1,000 mg Oral Q6H  . enoxaparin (LOVENOX) injection  40 mg Subcutaneous Q24H  . feeding supplement  1 Container Oral TID BM  . pantoprazole  40 mg Oral Daily   Continuous Infusions: . lactated ringers 100 mL/hr at 01/01/20 0058   PRN Meds:.alum & mag hydroxide-simeth, HYDROmorphone (DILAUDID) injection, ibuprofen, menthol-cetylpyridinium, methocarbamol, ondansetron **OR** ondansetron (ZOFRAN) IV, oxyCODONE, phenol, simethicone, traZODone  Assessment/Plan: Patient Active Problem List   Diagnosis Date Noted  . SBO (small bowel obstruction) ([place]) 12/23/2019   s/p Procedure(s): EXPLORATORY LAPAROTOMY Lysis Of Adhesion Small Bowel Resection 12/28/2019 by Dr Grandville Silos  Advance to soft diet pulm toilet, is, ambulate PPx: SCDs, Lov Dispo: If tolerates soft diet and having bowel function, possible d/c home tomorrow  Disposition:  LOS: 9 days   Nadeen Landau, M.D. [facility] Use [email or web address] to contact on call provider

## 2020-01-01 NOTE — Discharge Instructions (Addendum)
[facility]      [facility] [phone]  OPEN ABDOMINAL SURGERY: POST OP INSTRUCTIONS  Always review your discharge instruction sheet given to you by the facility where your surgery was performed.  IF YOU HAVE DISABILITY OR FAMILY LEAVE FORMS, YOU MUST BRING THEM TO THE OFFICE FOR PROCESSING.  PLEASE DO NOT GIVE THEM TO YOUR DOCTOR.  1. A prescription for pain medication may be given to you upon discharge.  Take your pain medication as prescribed, if needed.  If narcotic pain medicine is not needed, then you may take acetaminophen (Tylenol) or ibuprofen (Advil) as needed. 2. Take your usually prescribed medications unless otherwise directed. 3. If you need a refill on your pain medication, please contact your pharmacy. They will contact our office to request authorization.  Prescriptions will not be filled after 5pm or on week-ends. 4. You should follow a light diet the first few days after arrival home, such as soup and crackers, pudding, etc.unless your doctor has advised otherwise. A high-fiber, low fat diet can be resumed as tolerated.   Be sure to include lots of fluids daily. Most patients will experience some swelling and bruising on the chest and neck area.  Ice packs will help.  Swelling and bruising can take several days to resolve 5. Most patients will experience some swelling and bruising in the area of the incision. Ice pack will help. Swelling and bruising can take several days to resolve..  6. It is common to experience some constipation if taking pain medication after surgery.  Increasing fluid intake and taking a stool softener will usually help or prevent this problem from occurring.  A mild laxative (Milk of Magnesia or Miralax) should be taken according to package directions if there are no bowel movements after 48 hours. 7.  You may have steri-strips (small skin tapes) in place directly over the incision.  These strips should be left on the skin for 7-10 days.  If your  surgeon used skin glue on the incision, you may shower in 24 hours.  The glue will flake off over the next 2-3 weeks.  Any sutures or staples will be removed at the office during your follow-up visit. You may find that a light gauze bandage over your incision may keep your staples from being rubbed or pulled. You may shower and replace the bandage daily. 8. ACTIVITIES:  You may resume regular (light) daily activities beginning the next day--such as daily self-care, walking, climbing stairs--gradually increasing activities as tolerated.  You may have sexual intercourse when it is comfortable.  Refrain from any heavy lifting or straining until approved by your doctor. a. You may drive when you no longer are taking prescription pain medication, you can comfortably wear a seatbelt, and you can safely maneuver your car and apply brakes b. Return to Work: ___________________________________ 9. You should see your doctor in the office for a follow-up appointment approximately two weeks after your surgery.  Make sure that you call for this appointment within a day or two after you arrive home to insure a convenient appointment time. OTHER INSTRUCTIONS:  _____________________________________________________________ _____________________________________________________________  WHEN TO CALL YOUR DOCTOR: 1. Fever over 101.0 2. Inability to urinate 3. Nausea and/or vomiting 4. Extreme swelling or bruising 5. Continued bleeding from incision. 6. Increased pain, redness, or drainage from the incision. 7. Difficulty swallowing or breathing 8. Muscle cramping or spasms. 9. Numbness or tingling in hands or feet or around lips.  The clinic staff is available to   answer your questions during regular business hours.  Please don't hesitate to call and ask to speak to one of the nurses if you have concerns.  For further questions, please visit [email or web address]    Laparotoma exploratoria en adultos,  cuidados posteriores Exploratory Laparotomy, Adult, Care After Esta hoja le brinda informacin sobre cmo cuidarse despus del procedimiento. El mdico tambin podr darle instrucciones ms especficas. Comunquese con el mdico si tiene problemas o preguntas. Qu puedo esperar despus del procedimiento? Despus del procedimiento, es comn [employer] siguientes sntomas:  Dolor abdominal.  La fatiga.  Dolor de garganta debido a la cnula.  Falta de apetito. Sigue estas instrucciones en tu casa: Medicamentos  Use los medicamentos de venta libre y los recetados solamente como se lo haya indicado el mdico.  Si le recetaron un antibitico, tmelo como se lo haya indicado el mdico. No deje de tomar los antibiticos aunque comience a Sports administrator.  No conduzca ni opere maquinaria pesada mientras toma analgsicos.  Si toma analgsicos recetados, adopte medidas para prevenir o tratar el estreimiento. El mdico podra recomendarle que haga lo siguiente: ? Beba suficiente lquido como para mantener la orina de color amarillo plido. ? Consuma alimentos ricos en fibra, como frutas y verduras frescas, cereales integrales y frijoles. ? Limitar el consumo de alimentos ricos en grasa y azcares procesados, como los alimentos fritos o dulces. ? Tome un medicamento recetado o de venta libre para el estreimiento. Someterse a [place] y tomar medicamentos para el dolor pueden Agricultural engineer. Cuidado de la incisin   Siga las instrucciones del mdico acerca del cuidado de la incisin. Asegrese de hacer lo siguiente: ? Lvese las manos con agua y jabn antes de Quarry manager las vendas (vendaje). Use desinfectante para manos si no dispone de [place] y [place]. ? Cambie los vendajes como se lo haya indicado el mdico. ? No retire los puntos (suturas), la goma para cerrar la piel o las tiras [place]. Es posible que estos cierres cutneos deban quedar puestos en la piel durante 2semanas o ms  tiempo. Si los bordes de las tiras adhesivas empiezan a despegarse y Therapist, sports, puede recortar los que estn sueltos. No retire las tiras [facility] por completo a menos que el mdico se lo indique.  Si lo enviaron de regreso a su casa con un drenaje, siga las indicaciones del mdico sobre cmo cuidarlo.  [place] zona de la incisin para detectar signos de infeccin. Est atento a los siguientes signos: ? Enrojecimiento, hinchazn o dolor. ? Lquido o sangre. ? Calor. ? Pus o mal olor. Actividad   Haga reposo como se lo haya indicado el mdico. ? Evite estar sentado durante largos perodos sin moverse. Levntese y camine un poco cada 1 a 2 horas. Esto es importante para mejorar el flujo sanguneo y la respiracin. Pida ayuda si se siente dbil o inestable.  No levante ningn objeto que pese ms de 5libras (2,2kg) o el lmite de peso que le indique su mdico hasta que le diga que puede [place].  Pregntele al mdico cundo puede retomar sus actividades normales, por ejemplo, Forensic psychologist, regresar a Fish farm manager y Clinical biochemist. Comida y bebida  Puede seguir su dieta habitual. Riley Nearing en su dieta gran cantidad de cereales integrales, frutas y verduras. Esto ayudar a Contractor.  Beba suficiente lquido como para Theatre manager la orina de color amarillo plido. Baos  Mantenga la incisin limpia y [place]. Lmpiela con la frecuencia que le haya indicado el mdico: ?  Lave la incisin suavemente con agua y [place]. ? Enjuguela con agua para quitar todo el jabn. ? Squela bien con una toalla limpia dando golpecitos. No frote sobre la incisin.  Puede tomar una ducha despus de 48horas.  No tome baos de inmersin, no nade ni use el jacuzzi hasta que el mdico lo autorice. Indicaciones generales  No consuma ningn producto que contenga nicotina o tabaco, como cigarrillos y Administrator, Civil Service. Estos pueden retrasar la cicatrizacin despus de la  ciruga. Si necesita ayuda para dejar de consumir, consulta al [employer].  Use medias de compresin como se lo haya indicado su mdico. Estas medias ayudan a evitar la formacin de cogulos de Retail buyer y a reducir la hinchazn de las piernas.  Concurrir a todas las visitas de [address] se lo haya indicado el mdico. Esto es importante. Comuncate con un mdico si:  Tener fiebre.  Tiene escalofros.  El medicamento no IT trainer.  Sufre estreimiento o diarrea.  Tiene nuseas o vmitos.  Hay secrecin, enrojecimiento, hinchazn o dolor en el lugar de la incisin. Solicite ayuda inmediatamente si:  El Product/process development scientist.  No ha tenido deposiciones durante ms de 3das.  Tiene vmitos permanentes (persistentes).  Los bordes de la incisin se abren.  Siente calor y dolor a la palpacin en la pantorrilla, y la tiene hinchada.  Tiene dificultad para respirar.  Siente dolor en el pecho. Estos sntomas pueden representar un problema grave que constituye Radio broadcast assistant. No espere a ver si los sntomas desaparecen. Solicite atencin mdica de inmediato. Comunquese con el servicio de emergencias de su localidad (911 en los [place]). No conduzca por sus propios medios [employer] [facility]. Resumen  Despus de una laparotoma exploratoria es comn tener dolor abdominal. Tome los analgsicos de venta libre y los recetados solamente como se lo haya indicado el mdico.  Siga las instrucciones del mdico acerca del cuidado de la incisin. No tome baos de inmersin, no nade ni use el jacuzzi hasta que el mdico lo autorice.  Est atento a los signos y sntomas de infeccin luego de la [place], que incluyen [place], escalofros, secrecin en la incisin y un dolor abdominal que [place]. Esta informacin no tiene Theme park manager el consejo del mdico. Asegrese de hacerle al mdico cualquier pregunta que tenga. Document Revised: 02/08/2019 Document Reviewed: 02/18/2018 Elsevier  Patient Education  2020 [employer].

## 2020-01-02 MED ORDER — ALUM & MAG HYDROXIDE-SIMETH 200-200-20 MG/5ML PO SUSP
15.0000 mL | Freq: Once | ORAL | Status: AC
  Administered 2020-01-02: 15 mL via ORAL
  Filled 2020-01-02: qty 30

## 2020-01-02 MED ORDER — METHOCARBAMOL 500 MG PO TABS
750.0000 mg | ORAL_TABLET | Freq: Four times a day (QID) | ORAL | Status: DC
  Administered 2020-01-02 – 2020-01-03 (×4): 750 mg via ORAL
  Filled 2020-01-02 (×4): qty 2

## 2020-01-02 MED ORDER — POLYETHYLENE GLYCOL 3350 17 G PO PACK
17.0000 g | PACK | Freq: Every day | ORAL | Status: DC
  Administered 2020-01-02 – 2020-01-03 (×2): 17 g via ORAL
  Filled 2020-01-02 (×2): qty 1

## 2020-01-02 MED ORDER — METHOCARBAMOL 500 MG PO TABS: 500.0000 mg | ORAL_TABLET | Freq: Four times a day (QID) | ORAL | Status: DC

## 2020-01-02 MED ORDER — DOCUSATE SODIUM 100 MG PO CAPS
100.0000 mg | ORAL_CAPSULE | Freq: Two times a day (BID) | ORAL | Status: DC
  Administered 2020-01-02 – 2020-01-03 (×3): 100 mg via ORAL
  Filled 2020-01-02 (×3): qty 1

## 2020-01-02 MED ORDER — OXYCODONE HCL 5 MG PO TABS
5.0000 mg | ORAL_TABLET | Freq: Four times a day (QID) | ORAL | Status: DC | PRN
  Administered 2020-01-02 (×2): 10 mg via ORAL
  Filled 2020-01-02 (×2): qty 2

## 2020-01-02 NOTE — Progress Notes (Signed)
    5 Days Post-Op  Subjective: CC: Doing well. Tolerating diet without n/v. Had a liquidly bm yesterday and a hard bm this morning. Passing flatus. Mobilizing. Voiding without difficulty. Complains of pain around midline wound. Still taking IV pain medication.   Objective: Vital signs in last 24 hours: Temp:  [98.7 F (37.1 C)-99.7 F (37.6 C)] 98.7 F (37.1 C) (01/05 0240) Pulse Rate:  [84-96] 91 (01/05 0240) Resp:  [18] 18 (01/05 0240) BP: (98-118)/(63-77) 115/77 (01/05 0240) SpO2:  [96 %-98 %] 96 % (01/05 0240) Last BM Date: 12/20/19  Intake/Output from previous day: 01/04 0701 - 01/05 0700 In: 600 [P.O.:600] Out: 1750 [Urine:1750] Intake/Output this shift: No intake/output data recorded.  PE: Gen:  Alert, NAD, pleasant Pulm: Rate and effort normal Abd: Soft, ND, appropriately tender around midline incision. Honeycomb dressing removed. Midline with staples in place, c/d/i. +BS Ext:  No LE edema Psych: A&Ox3  Skin: no rashes noted, warm and dry   Lab Results:  Recent Labs    12/31/19 0317 01/01/20 0316  WBC 7.8 6.7  HGB 9.0* 8.7*  HCT 28.2* 27.1*  PLT 247 258   BMET Recent Labs    12/31/19 0317 01/01/20 0316  NA 136 137  K 3.0* 3.5  CL 100 103  CO2 25 26  GLUCOSE 99 100*  BUN <5* <5*  CREATININE 0.54 0.56  CALCIUM 8.0* 8.1*   PT/INR No results for input(s): LABPROT, INR in the last 72 hours. CMP     Component Value Date/Time   NA 137 01/01/2020 0316   K 3.5 01/01/2020 0316   CL 103 01/01/2020 0316   CO2 26 01/01/2020 0316   GLUCOSE 100 (H) 01/01/2020 0316   BUN <5 (L) 01/01/2020 0316   CREATININE 0.56 01/01/2020 0316   CALCIUM 8.1 (L) 01/01/2020 0316   PROT 7.5 12/23/2019 1742   ALBUMIN 4.0 12/23/2019 1742   AST 18 12/23/2019 1742   ALT 14 12/23/2019 1742   ALKPHOS 67 12/23/2019 1742   BILITOT 1.2 12/23/2019 1742   GFRNONAA >60 01/01/2020 0316   GFRAA >60 01/01/2020 0316   Lipase     Component Value Date/Time   LIPASE 24  12/23/2019 1742       Studies/Results: No results found.  Anti-infectives: Anti-infectives (From admission, onward)   Start     Dose/Rate Route Frequency Ordered Stop   12/28/19 1245  ceFAZolin (ANCEF) IVPB 2g/100 mL premix     2 g 200 mL/hr over 30 Minutes Intravenous  Once 12/28/19 1243 12/28/19 1315       Assessment/Plan SBO S/p EX Lap, LOA, SBR - Dr. Violeta Gelinas - 12/28/2019 - The patient is voiding well, tolerating diet without n/v, ambulating well, vital signs stable, incisions c/d/i  - She is still taking IV pain medication. Will switch oral medications. Will recheck later today and if tolerating pain with oral medication will d/c home  - Will start on Miralax for hard Bm's  FEN - Soft VTE - SCDs, Lovenox ID - Ancef periop  Plan:    LOS: 10 days    Jacinto Halim , [facility] 01/02/2020, 9:09 AM Please see Amion for pager number during day hours 7:00am-4:30pm

## 2020-01-02 NOTE — Progress Notes (Signed)
Pt walked around unit hall 3x before shift ends.

## 2020-01-03 MED ORDER — POLYETHYLENE GLYCOL 3350 17 G PO PACK: 17.0000 g | PACK | Freq: Every day | ORAL | 0 refills | Status: AC | PRN

## 2020-01-03 MED ORDER — OXYCODONE HCL 5 MG PO TABS: 5.0000 mg | ORAL_TABLET | Freq: Four times a day (QID) | ORAL | 0 refills | Status: DC | PRN

## 2020-01-03 MED ORDER — DOCUSATE SODIUM 100 MG PO CAPS: 100.0000 mg | ORAL_CAPSULE | Freq: Two times a day (BID) | ORAL | 0 refills | Status: AC | PRN

## 2020-01-03 MED ORDER — METHOCARBAMOL 750 MG PO TABS: 750.0000 mg | ORAL_TABLET | Freq: Four times a day (QID) | ORAL | 0 refills | Status: AC | PRN

## 2020-01-03 MED ORDER — ACETAMINOPHEN 325 MG PO TABS: 650.0000 mg | ORAL_TABLET | Freq: Four times a day (QID) | ORAL | Status: AC | PRN

## 2020-01-03 MED ORDER — ONDANSETRON 4 MG PO TBDP: 4.0000 mg | ORAL_TABLET | Freq: Four times a day (QID) | ORAL | 0 refills | Status: DC | PRN

## 2020-01-03 NOTE — Plan of Care (Signed)

## 2020-01-03 NOTE — Plan of Care (Signed)

## 2020-01-03 NOTE — Discharge Summary (Signed)
Patient ID: Linda Vang [id number] 11-Nov-1981 39 y.o.  Admit date: 12/23/2019 Discharge date: 01/03/2020  Admitting Diagnosis: pSBO vs enteritis   Discharge Diagnosis Patient Active Problem List   Diagnosis Date Noted  . SBO (small bowel obstruction) ([place]) 12/23/2019    Consultants None   Reason for Admission: Linda Vang is an 39 y.o. female with 2d hx of progressive abdominal cramps - she was here yesterday for this and seen by our service. She was discharged after passing a PO challenge but hasn't felt well since arriving home. She has intermittent nausea since going home and intermittent cramps. Denies fever/chills. Some flatus; last BM was 12/23.   Procedures Dr. Georganna Skeans - Exploratory Laparotomy, Lysis Of Adhesion, SBR - 12/28/2019  Hospital Course:  Patient admitted to the general surgery service. NGT placed. Patient was started on SBO protocol. Patient did not improve after 48 hours with SBO protocol but pain had improved. SBO protocol was repeated on 12/28. On 12/30 patient began passing flatus. NGT was d/c and patient was started on CLD. Overnight patient had multiple episodes of emesis. She was taken to the OR on 12/31 where she underwent a exploratory Laparotomy, Lysis Of Adhesion, SBR by Dr. Grandville Silos. Patient tolerated the procedure well and was transferred back to the floor. NGT was removed 1/2 and she was started on sips of clears. She regained bowel function on 1/3, her diet was advanced and tolerated. On 1/6,  POD 6, the patient was voiding well, tolerating diet, ambulating well, pain well controlled, vital signs stable, incisions c/d/i and felt stable for discharge home. She was provided a note for work. Follow up as listed below.   Physical Exam: Gen:  Alert, NAD, pleasant Pulm: Normal rate and effort  Abd: Soft, appropriately tender around midline incision without peritonitis, midline wound with staples in place, +BS  Ext:  No LE  edema  Psych: A&Ox3  Skin: no rashes noted, warm and dry  Allergies as of 01/03/2020   No Known Allergies     Medication List    TAKE these medications   acetaminophen 325 MG tablet Commonly known as: TYLENOL Take 2 tablets (650 mg total) by mouth every 6 (six) hours as needed.   docusate sodium 100 MG capsule Commonly known as: COLACE Take 1 capsule (100 mg total) by mouth 2 (two) times daily as needed for mild constipation.   methocarbamol 750 MG tablet Commonly known as: ROBAXIN Take 1 tablet (750 mg total) by mouth every 6 (six) hours as needed for muscle spasms.   ondansetron 4 MG disintegrating tablet Commonly known as: ZOFRAN-ODT Take 1 tablet (4 mg total) by mouth every 6 (six) hours as needed for nausea.   oxyCODONE 5 MG immediate release tablet Commonly known as: Oxy IR/ROXICODONE Take 1 tablet (5 mg total) by mouth every 6 (six) hours as needed for breakthrough pain.   polyethylene glycol 17 g packet Commonly known as: MIRALAX / GLYCOLAX Take 17 g by mouth daily as needed for mild constipation.        Follow-up [facility] [place]. Go on 01/09/2020.   Specialty: General Surgery Why: Your appointment is 01/09/2020 at 2pm for staple removal. Please arrive 30 minutes prior to your appointment to check in and fill out paperwork. Bring photo ID and insurance information. Contact information: [address] [place] [phone]       Georganna Skeans, MD. Daphane Shepherd on 01/31/2020.  Specialty: General Surgery Why: Your appointment is 01/31/2020 at 9:10am to follow up from your recent surgery. Please arrive 15 minutes early to check in. Contact information: [address] [place] [address] [place] [address] [phone]           Signed: Leary Roca, [facility] 01/03/2020, 7:51 AM Please see Amion for pager number during day hours 7:00am-4:30pm

## 2020-01-03 NOTE — Progress Notes (Signed)
Provided discharge education/instructions, all questions and concerns addressed, Pt not in distress, daughter at bedside, discharged home with belongings. 

## 2020-01-17 ENCOUNTER — Observation Stay (HOSPITAL_COMMUNITY)
Admission: EM | Admit: 2020-01-17 | Discharge: 2020-01-18 | Disposition: A | Discharge status: Home / Self Care | Payer: Medicaid Other | Attending: Surgery | Admitting: Surgery

## 2020-01-17 ENCOUNTER — Other Ambulatory Visit (HOSPITAL_COMMUNITY): Payer: Self-pay | Admitting: Student

## 2020-01-17 ENCOUNTER — Other Ambulatory Visit: Payer: Self-pay

## 2020-01-17 ENCOUNTER — Encounter (HOSPITAL_COMMUNITY): Payer: Self-pay | Admitting: Emergency Medicine

## 2020-01-17 ENCOUNTER — Ambulatory Visit (HOSPITAL_COMMUNITY)
Admission: RE | Admit: 2020-01-17 | Discharge: 2020-01-17 | Disposition: A | Discharge status: Home / Self Care | Payer: Medicaid Other | Source: Ambulatory Visit | Attending: Student | Admitting: Student

## 2020-01-17 DIAGNOSIS — T8149XA Infection following a procedure, other surgical site, initial encounter: Principal | ICD-10-CM | POA: Diagnosis present

## 2020-01-17 DIAGNOSIS — R112 Nausea with vomiting, unspecified: Secondary | ICD-10-CM

## 2020-01-17 DIAGNOSIS — Z79899 Other long term (current) drug therapy: Secondary | ICD-10-CM | POA: Diagnosis not present

## 2020-01-17 DIAGNOSIS — Z9889 Other specified postprocedural states: Secondary | ICD-10-CM | POA: Diagnosis not present

## 2020-01-17 DIAGNOSIS — Z20822 Contact with and (suspected) exposure to covid-19: Secondary | ICD-10-CM | POA: Diagnosis not present

## 2020-01-17 DIAGNOSIS — Y838 Other surgical procedures as the cause of abnormal reaction of the patient, or of later complication, without mention of misadventure at the time of the procedure: Secondary | ICD-10-CM | POA: Diagnosis not present

## 2020-01-17 LAB — CBC WITH DIFFERENTIAL/PLATELET
Abs Immature Granulocytes: 0.1 10*3/uL — ABNORMAL HIGH (ref 0.00–0.07)
Basophils Absolute: 0 10*3/uL (ref 0.0–0.1)
Basophils Relative: 1 %
Eosinophils Absolute: 0.1 10*3/uL (ref 0.0–0.5)
Eosinophils Relative: 1 %
HCT: 32 % — ABNORMAL LOW (ref 36.0–46.0)
Hemoglobin: 9.8 g/dL — ABNORMAL LOW (ref 12.0–15.0)
Immature Granulocytes: 2 %
Lymphocytes Relative: 43 %
Lymphs Abs: 2.9 10*3/uL (ref 0.7–4.0)
MCH: 24.4 pg — ABNORMAL LOW (ref 26.0–34.0)
MCHC: 30.6 g/dL (ref 30.0–36.0)
MCV: 79.8 fL — ABNORMAL LOW (ref 80.0–100.0)
Monocytes Absolute: 0.5 10*3/uL (ref 0.1–1.0)
Monocytes Relative: 7 %
Neutro Abs: 3.1 10*3/uL (ref 1.7–7.7)
Neutrophils Relative %: 46 %
Platelets: 541 10*3/uL — ABNORMAL HIGH (ref 150–400)
RBC: 4.01 MIL/uL (ref 3.87–5.11)
RDW: 16.7 % — ABNORMAL HIGH (ref 11.5–15.5)
WBC: 6.7 10*3/uL (ref 4.0–10.5)
nRBC: 0 % (ref 0.0–0.2)

## 2020-01-17 LAB — I-STAT CHEM 8, ED
BUN: 6 mg/dL (ref 6–20)
Calcium, Ion: 1.14 mmol/L — ABNORMAL LOW (ref 1.15–1.40)
Chloride: 96 mmol/L — ABNORMAL LOW (ref 98–111)
Creatinine, Ser: 0.6 mg/dL (ref 0.44–1.00)
Glucose, Bld: 103 mg/dL — ABNORMAL HIGH (ref 70–99)
HCT: 30 % — ABNORMAL LOW (ref 36.0–46.0)
Hemoglobin: 10.2 g/dL — ABNORMAL LOW (ref 12.0–15.0)
Potassium: 3.4 mmol/L — ABNORMAL LOW (ref 3.5–5.1)
Sodium: 136 mmol/L (ref 135–145)
TCO2: 28 mmol/L (ref 22–32)

## 2020-01-17 LAB — RESPIRATORY PANEL BY RT PCR (FLU A&B, COVID)
Influenza A by PCR: NEGATIVE
Influenza B by PCR: NEGATIVE
SARS Coronavirus 2 by RT PCR: NEGATIVE

## 2020-01-17 LAB — COMPREHENSIVE METABOLIC PANEL
ALT: 36 U/L (ref 0–44)
AST: 22 U/L (ref 15–41)
Albumin: 3.1 g/dL — ABNORMAL LOW (ref 3.5–5.0)
Alkaline Phosphatase: 114 U/L (ref 38–126)
Anion gap: 10 (ref 5–15)
BUN: 8 mg/dL (ref 6–20)
CO2: 28 mmol/L (ref 22–32)
Calcium: 9 mg/dL (ref 8.9–10.3)
Chloride: 98 mmol/L (ref 98–111)
Creatinine, Ser: 0.68 mg/dL (ref 0.44–1.00)
GFR calc Af Amer: >60 mL/min (ref >60)
GFR calc non Af Amer: >60 mL/min (ref >60)
Glucose, Bld: 105 mg/dL — ABNORMAL HIGH (ref 70–99)
Potassium: 3.5 mmol/L (ref 3.5–5.1)
Sodium: 136 mmol/L (ref 135–145)
Total Bilirubin: 0.9 mg/dL (ref 0.3–1.2)
Total Protein: 7.2 g/dL (ref 6.5–8.1)

## 2020-01-17 LAB — PREGNANCY, URINE: Preg Test, Ur: NEGATIVE

## 2020-01-17 LAB — LACTIC ACID, PLASMA: Lactic Acid, Venous: 1.4 mmol/L (ref 0.5–1.9)

## 2020-01-17 LAB — URINALYSIS, ROUTINE W REFLEX MICROSCOPIC
Bilirubin Urine: NEGATIVE
Glucose, UA: NEGATIVE mg/dL
Hgb urine dipstick: NEGATIVE
Ketones, ur: NEGATIVE mg/dL
Leukocytes,Ua: NEGATIVE
Nitrite: NEGATIVE
Protein, ur: NEGATIVE mg/dL
Specific Gravity, Urine: 1.035 — ABNORMAL HIGH (ref 1.005–1.030)
pH: 6 (ref 5.0–8.0)

## 2020-01-17 LAB — LIPASE, BLOOD: Lipase: 27 U/L (ref 11–51)

## 2020-01-17 MED ORDER — IOHEXOL 300 MG/ML  SOLN
100.0000 mL | Freq: Once | INTRAMUSCULAR | Status: AC | PRN
  Administered 2020-01-17: 16:00:00 100 mL via INTRAVENOUS

## 2020-01-17 MED ORDER — ACETAMINOPHEN 325 MG PO TABS
650.0000 mg | ORAL_TABLET | Freq: Four times a day (QID) | ORAL | Status: DC | PRN
  Administered 2020-01-18 (×2): 650 mg via ORAL
  Filled 2020-01-17 (×2): qty 2

## 2020-01-17 MED ORDER — SODIUM CHLORIDE 0.9 % IV SOLN: INTRAVENOUS | Status: DC

## 2020-01-17 MED ORDER — SODIUM CHLORIDE 0.9 % IV BOLUS
500.0000 mL | Freq: Once | INTRAVENOUS | Status: AC
  Administered 2020-01-17: 500 mL via INTRAVENOUS

## 2020-01-17 MED ORDER — PIPERACILLIN-TAZOBACTAM 3.375 G IVPB 30 MIN
3.3750 g | Freq: Once | INTRAVENOUS | Status: AC
  Administered 2020-01-17: 3.375 g via INTRAVENOUS
  Filled 2020-01-17: qty 50

## 2020-01-17 MED ORDER — MORPHINE SULFATE (PF) 4 MG/ML IV SOLN
4.0000 mg | Freq: Once | INTRAVENOUS | Status: AC
  Administered 2020-01-17: 4 mg via INTRAVENOUS
  Filled 2020-01-17: qty 1

## 2020-01-17 MED ORDER — MORPHINE SULFATE (PF) 2 MG/ML IV SOLN: 1.0000 mg | INTRAVENOUS | Status: DC | PRN

## 2020-01-17 MED ORDER — OXYCODONE HCL 5 MG PO TABS
5.0000 mg | ORAL_TABLET | Freq: Four times a day (QID) | ORAL | Status: DC | PRN
  Administered 2020-01-18: 5 mg via ORAL
  Filled 2020-01-17: qty 1

## 2020-01-17 MED ORDER — SIMETHICONE 80 MG PO CHEW
40.0000 mg | CHEWABLE_TABLET | Freq: Four times a day (QID) | ORAL | Status: DC | PRN
  Administered 2020-01-18: 40 mg via ORAL
  Filled 2020-01-17: qty 1

## 2020-01-17 MED ORDER — ENOXAPARIN SODIUM 40 MG/0.4ML ~~LOC~~ SOLN
40.0000 mg | SUBCUTANEOUS | Status: DC
  Administered 2020-01-17: 40 mg via SUBCUTANEOUS
  Filled 2020-01-17: qty 0.4

## 2020-01-17 MED ORDER — ZOLPIDEM TARTRATE 5 MG PO TABS
5.0000 mg | ORAL_TABLET | Freq: Every evening | ORAL | Status: DC | PRN
  Administered 2020-01-18: 5 mg via ORAL
  Filled 2020-01-17: qty 1

## 2020-01-17 MED ORDER — METHOCARBAMOL 500 MG PO TABS: 750.0000 mg | ORAL_TABLET | Freq: Four times a day (QID) | ORAL | Status: DC | PRN

## 2020-01-17 MED ORDER — MORPHINE SULFATE (PF) 2 MG/ML IV SOLN
2.0000 mg | Freq: Once | INTRAVENOUS | Status: AC
  Administered 2020-01-17: 2 mg via INTRAVENOUS
  Filled 2020-01-17: qty 1

## 2020-01-17 MED ORDER — PIPERACILLIN-TAZOBACTAM 3.375 G IVPB
3.3750 g | Freq: Three times a day (TID) | INTRAVENOUS | Status: DC
  Administered 2020-01-17 – 2020-01-18 (×2): 3.375 g via INTRAVENOUS
  Filled 2020-01-17 (×2): qty 50

## 2020-01-17 MED ORDER — POLYETHYLENE GLYCOL 3350 17 G PO PACK: 17.0000 g | PACK | Freq: Every day | ORAL | Status: DC | PRN

## 2020-01-17 MED ORDER — PROCHLORPERAZINE EDISYLATE 10 MG/2ML IJ SOLN: 5.0000 mg | Freq: Four times a day (QID) | INTRAMUSCULAR | Status: DC | PRN

## 2020-01-17 MED ORDER — PROCHLORPERAZINE MALEATE 10 MG PO TABS
10.0000 mg | ORAL_TABLET | Freq: Four times a day (QID) | ORAL | Status: DC | PRN
  Filled 2020-01-17: qty 1

## 2020-01-17 MED ORDER — ONDANSETRON 4 MG PO TBDP: 4.0000 mg | ORAL_TABLET | Freq: Four times a day (QID) | ORAL | Status: DC | PRN

## 2020-01-17 NOTE — ED Triage Notes (Signed)
Used spanish interpreter Shanda Bumps [phone].  Pt reports had an Ct scan today and was called and told to go to the ED to have fluid drain in her abd. Reports she is still on antibiotics after her small intestines surgery on 12/28/19.

## 2020-01-17 NOTE — ED Notes (Signed)
ED TO INPATIENT HANDOFF REPORT  ED Nurse Name and Phone #: Romy Ipock  2  S Name/Age/Gender Linda Vang 39 y.o. female Room/Bed: WA15/WA15  Code Status   Code Status: Full Code  Home/SNF/Other Home Patient oriented to: self Is this baseline? Yes   Triage Complete: Triage complete  Chief Complaint Wound infection after surgery [T81.49XA]  Triage Note Used spanish interpreter Shanda Bumps [phone].  Pt reports had an Ct scan today and was called and told to go to the ED to have fluid drain in her abd. Reports she is still on antibiotics after her small intestines surgery on 12/28/19.     Allergies No Known Allergies  Level of Care/Admitting Diagnosis ED Disposition    ED Disposition Condition Comment   Admit  Hospital Area: [facility] [[id number]]  Level of Care: Med-Surg [16]  Covid Evaluation: Confirmed COVID Negative  Date Laboratory Confirmed COVID Negative: 01/17/2020  Diagnosis: Wound infection after surgery [[id number]]  Admitting Physician: CCS, MD [[id number]]  Attending Physician: CCS, MD [[id number]]       B Medical/Surgery History History reviewed. No pertinent past medical history. Past Surgical History:  Procedure Laterality Date  . BOWEL RESECTION N/A 12/28/2019   Procedure: Small Bowel Resection;  Surgeon: Violeta Gelinas, MD;  Location: [facility] OR;  Service: General;  Laterality: N/A;  . CESAREAN SECTION    . CHOLECYSTECTOMY    . LAPAROTOMY N/A 12/28/2019   Procedure: EXPLORATORY LAPAROTOMY;  Surgeon: Violeta Gelinas, MD;  Location: [facility] OR;  Service: General;  Laterality: N/A;  . LYSIS OF ADHESION N/A 12/28/2019   Procedure: Lysis Of Adhesion;  Surgeon: Violeta Gelinas, MD;  Location: [facility] OR;  Service: General;  Laterality: N/A;     A IV Location/Drains/Wounds Patient Lines/Drains/Airways Status   Active Line/Drains/Airways    Name:   Placement date:   Placement time:   Site:   Days:   Peripheral IV 01/17/20 Left Antecubital   01/17/20    1823     Antecubital   less than 1   Incision (Closed) 12/28/19 Abdomen Other (Comment)   12/28/19    1356     20          Intake/Output Last 24 hours No intake or output data in the 24 hours ending 01/17/20 2204  Labs/Imaging Results for orders placed or performed during the hospital encounter of 01/17/20 (from the past 48 hour(s))  Respiratory Panel by RT PCR (Flu A&B, Covid) - Nasopharyngeal Swab     Status: None   Collection Time: 01/17/20  6:14 PM   Specimen: Nasopharyngeal Swab  Result Value Ref Range   SARS Coronavirus 2 by RT PCR NEGATIVE NEGATIVE    Comment: (NOTE) SARS-CoV-2 target nucleic acids are NOT DETECTED. The SARS-CoV-2 RNA is generally detectable in upper respiratoy specimens during the acute phase of infection. The lowest concentration of SARS-CoV-2 viral copies this assay can detect is 131 copies/mL. A negative result does not preclude SARS-Cov-2 infection and should not be used as the sole basis for treatment or other patient management decisions. A negative result may occur with  improper specimen collection/handling, submission of specimen other than nasopharyngeal swab, presence of viral mutation(s) within the areas targeted by this assay, and inadequate number of viral copies (<131 copies/mL). A negative result must be combined with clinical observations, patient history, and epidemiological information. The expected result is Negative. Fact Sheet for Patients:  https://www.moore.com/ Fact Sheet for Healthcare Providers:  https://www.young.biz/ This test is not yet ap proved or cleared  by the [place] and  has been authorized for detection and/or diagnosis of SARS-CoV-2 by FDA under an Emergency Use Authorization (EUA). This EUA will remain  in effect (meaning this test can be used) for the duration of the COVID-19 declaration under Section 564(b)(1) of the Act, 21 U.S.C. section 360bbb-3(b)(1), unless the  authorization is terminated or revoked sooner.    Influenza A by PCR NEGATIVE NEGATIVE   Influenza B by PCR NEGATIVE NEGATIVE    Comment: (NOTE) The Xpert Xpress SARS-CoV-2/FLU/RSV assay is intended as an aid in  the diagnosis of influenza from Nasopharyngeal swab specimens and  should not be used as a sole basis for treatment. Nasal washings and  aspirates are unacceptable for Xpert Xpress SARS-CoV-2/FLU/RSV  testing. Fact Sheet for Patients: https://www.moore.com/ Fact Sheet for Healthcare Providers: https://www.young.biz/ This test is not yet approved or cleared by the [place] FDA and  has been authorized for detection and/or diagnosis of SARS-CoV-2 by  FDA under an Emergency Use Authorization (EUA). This EUA will remain  in effect (meaning this test can be used) for the duration of the  Covid-19 declaration under Section 564(b)(1) of the Act, 21  U.S.C. section 360bbb-3(b)(1), unless the authorization is  terminated or revoked. Performed at [facility], [address], [place], [place] [address]   CBC with Differential     Status: Abnormal   Collection Time: 01/17/20  6:21 PM  Result Value Ref Range   WBC 6.7 4.0 - 10.5 K/uL   RBC 4.01 3.87 - 5.11 MIL/uL   Hemoglobin 9.8 (L) 12.0 - 15.0 g/dL   HCT 03.0 (L) 09.2 - 33.0 %   MCV 79.8 (L) 80.0 - 100.0 fL   MCH 24.4 (L) 26.0 - 34.0 pg   MCHC 30.6 30.0 - 36.0 g/dL   RDW 07.6 (H) 22.6 - 33.3 %   Platelets 541 (H) 150 - 400 K/uL   nRBC 0.0 0.0 - 0.2 %   Neutrophils Relative % 46 %   Neutro Abs 3.1 1.7 - 7.7 K/uL   Lymphocytes Relative 43 %   Lymphs Abs 2.9 0.7 - 4.0 K/uL   Monocytes Relative 7 %   Monocytes Absolute 0.5 0.1 - 1.0 K/uL   Eosinophils Relative 1 %   Eosinophils Absolute 0.1 0.0 - 0.5 K/uL   Basophils Relative 1 %   Basophils Absolute 0.0 0.0 - 0.1 K/uL   Immature Granulocytes 2 %   Abs Immature Granulocytes 0.10 (H) 0.00 - 0.07 K/uL    Comment:  Performed at [facility], [address], [place], [place] [address]  Comprehensive metabolic panel     Status: Abnormal   Collection Time: 01/17/20  6:21 PM  Result Value Ref Range   Sodium 136 135 - 145 mmol/L   Potassium 3.5 3.5 - 5.1 mmol/L   Chloride 98 98 - 111 mmol/L   CO2 28 22 - 32 mmol/L   Glucose, Bld 105 (H) 70 - 99 mg/dL   BUN 8 6 - 20 mg/dL   Creatinine, Ser 5.63 0.44 - 1.00 mg/dL   Calcium 9.0 8.9 - 89.3 mg/dL   Total Protein 7.2 6.5 - 8.1 g/dL   Albumin 3.1 (L) 3.5 - 5.0 g/dL   AST 22 15 - 41 U/L   ALT 36 0 - 44 U/L   Alkaline Phosphatase 114 38 - 126 U/L   Total Bilirubin 0.9 0.3 - 1.2 mg/dL   GFR calc non Af Amer >60 >60 mL/min  GFR calc Af Amer >60 >60 mL/min   Anion gap 10 5 - 15    Comment: Performed at [facility], [address] [place], [place] [address]  Lipase, blood     Status: None   Collection Time: 01/17/20  6:21 PM  Result Value Ref Range   Lipase 27 11 - 51 U/L    Comment: Performed at [facility], [address] [place], [place] [address]  Lactic acid, plasma     Status: None   Collection Time: 01/17/20  6:21 PM  Result Value Ref Range   Lactic Acid, Venous 1.4 0.5 - 1.9 mmol/L    Comment: Performed at [facility], [address] [place], [place] [address]  I-stat chem 8, ED (not at [facility] or [facility])     Status: Abnormal   Collection Time: 01/17/20  6:35 PM  Result Value Ref Range   Sodium 136 135 - 145 mmol/L   Potassium 3.4 (L) 3.5 - 5.1 mmol/L   Chloride 96 (L) 98 - 111 mmol/L   BUN 6 6 - 20 mg/dL   Creatinine, Ser 9.140.60 0.44 - 1.00 mg/dL   Glucose, Bld 782103 (H) 70 - 99 mg/dL   Calcium, Ion 9.561.14 (L) 1.15 - 1.40 mmol/L   TCO2 28 22 - 32 mmol/L   Hemoglobin 10.2 (L) 12.0 - 15.0 g/dL   HCT 21.330.0 (L) 08.636.0 - 57.846.0 %  Urinalysis, Routine w reflex microscopic     Status: Abnormal   Collection Time: 01/17/20  8:01 PM  Result Value Ref Range   Color, Urine YELLOW  YELLOW   APPearance CLEAR CLEAR   Specific Gravity, Urine 1.035 (H) 1.005 - 1.030   pH 6.0 5.0 - 8.0   Glucose, UA NEGATIVE NEGATIVE mg/dL   Hgb urine dipstick NEGATIVE NEGATIVE   Bilirubin Urine NEGATIVE NEGATIVE   Ketones, ur NEGATIVE NEGATIVE mg/dL   Protein, ur NEGATIVE NEGATIVE mg/dL   Nitrite NEGATIVE NEGATIVE   Leukocytes,Ua NEGATIVE NEGATIVE    Comment: Performed at [facility], [address] [place], [place] [address]  Pregnancy, urine     Status: None   Collection Time: 01/17/20  8:43 PM  Result Value Ref Range   Preg Test, Ur NEGATIVE NEGATIVE    Comment:        THE SENSITIVITY OF THIS METHODOLOGY IS >20 mIU/mL. Performed at [facility], [address] [place], [place] [address]    CT ABDOMEN PELVIS W CONTRAST  Result Date: 01/17/2020 CLINICAL DATA:  Status post laparotomy for bowel obstruction, 12/28/2019, nausea, vomiting EXAM: CT ABDOMEN AND PELVIS WITH CONTRAST TECHNIQUE: Multidetector CT imaging of the abdomen and pelvis was performed using the standard protocol following bolus administration of intravenous contrast. CONTRAST:  100mL OMNIPAQUE IOHEXOL 300 MG/ML SOLN, additional oral enteric contrast COMPARISON:  12/23/2019 FINDINGS: Lower chest: Bibasilar scarring and or atelectasis. Hepatobiliary: No focal liver abnormality is seen. Status post cholecystectomy. No biliary dilatation. Pancreas: Unremarkable. No pancreatic ductal dilatation or surrounding inflammatory changes. Spleen: Normal in size without significant abnormality. Adrenals/Urinary Tract: Adrenal glands are unremarkable. Kidneys are normal, without renal calculi, solid lesion, or hydronephrosis. Bladder is unremarkable. Stomach/Bowel: Stomach is within normal limits. Appendix appears normal. Postoperative findings of distal small bowel resection and reanastomosis without evidence of recurrent obstruction (series 3, image 60). Fat stranding about the rectum (series 3,  image 77, series 7, image 68). Vascular/Lymphatic: No significant vascular findings are present. No enlarged abdominal or pelvic  lymph nodes. Reproductive: Fluid in the endometrial cavity. Other: Midline ventral laparotomy wound, which contains a fluid collection external to the abdominal wall at the inferior aspect of the wound, measuring approximately 2.5 x 1.9 x 2.4 cm (series 3, image 60, series 7, image 75). There is a small, rim enhancing fluid collection in the low pelvis between the rectum and vagina/cervix, measuring approximately 1.1 cm (series 3, image 77). Musculoskeletal: No acute or significant osseous findings. IMPRESSION: 1. Fat stranding about the rectum (series 3, image 77, series 7, image 68). There is a small, rim enhancing fluid collection in the low pelvis between the rectum and vagina/cervix, measuring approximately 1.1 cm (series 3, image 77). Findings are suspicious for diverticulitis complicated by abscess. 2. Midline ventral laparotomy wound, which contains a fluid collection external to the abdominal wall at the inferior aspect of the wound, measuring approximately 2.5 x 1.9 x 2.4 cm (series 3, image 60, series 7, image 75). This is nonspecific and may reflect postoperative hematoma/seroma, or alternately abscess. The presence or absence of infection is not established by CT. 3. Postoperative findings of distal small bowel resection and reanastomosis without evidence of recurrent obstruction. These results will be called to the ordering clinician or representative by the Radiologist Assistant, and communication documented in the PACS or zVision Dashboard. Electronically Signed   By: Lauralyn Primes M.D.   On: 01/17/2020 16:03    Pending Labs Unresulted Labs (From admission, onward)    Start     Ordered   01/18/20 0500  CBC  Tomorrow morning,   R     01/17/20 2152   01/18/20 0500  Basic metabolic panel  Tomorrow morning,   R     01/17/20 2152   01/17/20 1750  Blood culture (routine  x 2)  BLOOD CULTURE X 2,   STAT     01/17/20 1750   Signed and Held  CBC  (enoxaparin (LOVENOX)    CrCl >/= 30 ml/min)  Once,   R    Comments: Baseline for enoxaparin therapy IF NOT ALREADY DRAWN.  Notify MD if PLT < 100 K.    Signed and Held   Signed and Held  Creatinine, serum  (enoxaparin (LOVENOX)    CrCl >/= 30 ml/min)  Once,   R    Comments: Baseline for enoxaparin therapy IF NOT ALREADY DRAWN.    Signed and Held   Signed and Held  Creatinine, serum  (enoxaparin (LOVENOX)    CrCl >/= 30 ml/min)  Weekly,   R    Comments: while on enoxaparin therapy    Signed and Held          Vitals/Pain Today's Vitals   01/17/20 2030 01/17/20 2100 01/17/20 2129 01/17/20 2130  BP: 101/72 106/73  108/74  Pulse: 87 82  79  Resp: 18 18  18   Temp:      TempSrc:      SpO2: 97% 95%  96%  PainSc:   3      Isolation Precautions No active isolations  Medications Medications  acetaminophen (TYLENOL) tablet 650 mg (has no administration in time range)  oxyCODONE (Oxy IR/ROXICODONE) immediate release tablet 5 mg (has no administration in time range)  ondansetron (ZOFRAN-ODT) disintegrating tablet 4 mg (has no administration in time range)  polyethylene glycol (MIRALAX / GLYCOLAX) packet 17 g (has no administration in time range)  methocarbamol (ROBAXIN) tablet 750 mg (has no administration in time range)  morphine 2 MG/ML injection 1-2 mg (has no administration  in time range)  zolpidem (AMBIEN) tablet 5 mg (has no administration in time range)  prochlorperazine (COMPAZINE) tablet 10 mg (has no administration in time range)    Or  prochlorperazine (COMPAZINE) injection 5-10 mg (has no administration in time range)  simethicone (MYLICON) chewable tablet 40 mg (has no administration in time range)  sodium chloride 0.9 % bolus 500 mL (0 mLs Intravenous Stopped 01/17/20 2006)  piperacillin-tazobactam (ZOSYN) IVPB 3.375 g (0 g Intravenous Stopped 01/17/20 2006)  morphine 2 MG/ML injection 2 mg (2 mg  Intravenous Given 01/17/20 1825)  morphine 4 MG/ML injection 4 mg (4 mg Intravenous Given 01/17/20 2048)    Mobility walks Low fall risk   Focused Assessments Gastro   R Recommendations: See Admitting Provider Note  Report given to:   Additional Notes: Spanish speaking

## 2020-01-17 NOTE — ED Provider Notes (Addendum)
[facility]-EMERGENCY DEPT Provider Note   CSN: [id number] Arrival date & time: 01/17/20  1730     History Chief Complaint  Patient presents with  . abnormal CT-fluid in abd   Video interpreter used during this visit Linda Vang is a 39 y.o. female presents today after receiving call regarding fluid in the abdomen seen on a CT abdomen pelvis today.  Patient had small bowel obstruction due to adhesions in late December 2020.  She had exploratory laparotomy performed by Dr. Janee Morn on 12/28/2019.  She reports that over the past 1 week she has developed increasing abdominal pain, fever/chills and nausea/vomiting.  She describes a generalized lower abdominal pain radiating to the left side constant worsened with eating and with palpation no alleviating factors, moderate intensity.  She has been taking Tylenol for her pain and fevers with minimal relief.  She reports few episodes of nonbloody/nonbilious emesis over the last 2-3 days.  Denies fall/injury, cough, chest pain/shortness of breath, dysuria/hematuria, diarrhea or any additional concerns. HPI     History reviewed. No pertinent past medical history.  Patient Active Problem List   Diagnosis Date Noted  . Wound infection after surgery 01/17/2020  . SBO (small bowel obstruction) (HCC) 12/23/2019    Past Surgical History:  Procedure Laterality Date  . BOWEL RESECTION N/A 12/28/2019   Procedure: Small Bowel Resection;  Surgeon: Violeta Gelinas, MD;  Location: [facility] OR;  Service: General;  Laterality: N/A;  . CESAREAN SECTION    . CHOLECYSTECTOMY    . LAPAROTOMY N/A 12/28/2019   Procedure: EXPLORATORY LAPAROTOMY;  Surgeon: Violeta Gelinas, MD;  Location: [facility] OR;  Service: General;  Laterality: N/A;  . LYSIS OF ADHESION N/A 12/28/2019   Procedure: Lysis Of Adhesion;  Surgeon: Violeta Gelinas, MD;  Location: [facility] OR;  Service: General;  Laterality: N/A;     OB History   No obstetric history on file.     No family history on file.  Social History   Tobacco Use  . Smoking status: Never Smoker  . Smokeless tobacco: Never Used  Substance Use Topics  . Alcohol use: Never  . Drug use: Never    Home Medications Prior to Admission medications   Medication Sig Start Date End Date Taking? Authorizing Provider  acetaminophen (TYLENOL) 325 MG tablet Take 2 tablets (650 mg total) by mouth every 6 (six) hours as needed. 01/03/20  Yes Maczis, Elmer Sow, PA-C  docusate sodium (COLACE) 100 MG capsule Take 1 capsule (100 mg total) by mouth 2 (two) times daily as needed for mild constipation. 01/03/20  Yes Maczis, Elmer Sow, PA-C  methocarbamol (ROBAXIN) 750 MG tablet Take 1 tablet (750 mg total) by mouth every 6 (six) hours as needed for muscle spasms. 01/03/20  Yes Maczis, Elmer Sow, PA-C  ondansetron (ZOFRAN-ODT) 4 MG disintegrating tablet Take 1 tablet (4 mg total) by mouth every 6 (six) hours as needed for nausea. 01/03/20  Yes Maczis, Elmer Sow, PA-C  oxyCODONE (OXY IR/ROXICODONE) 5 MG immediate release tablet Take 1 tablet (5 mg total) by mouth every 6 (six) hours as needed for breakthrough pain. 01/03/20  Yes Maczis, Elmer Sow, PA-C  polyethylene glycol (MIRALAX / GLYCOLAX) 17 g packet Take 17 g by mouth daily as needed for mild constipation. 01/03/20  Yes Maczis, Elmer Sow, PA-C    Allergies    Patient has no known allergies.  Review of Systems   Review of Systems Ten systems are reviewed and are negative for acute change except as  noted in the HPI  Physical Exam Updated Vital Signs BP 108/74   Pulse 79   Temp 98.5 F (36.9 C) (Oral)   Resp 18   LMP 12/28/2019   SpO2 96%   Physical Exam Constitutional:      General: She is not in acute distress.    Appearance: Normal appearance. She is well-developed. She is not ill-appearing or diaphoretic.  HENT:     Head: Normocephalic and atraumatic.     Right Ear: External ear normal.     Left Ear: External ear normal.     Nose: Nose normal.   Eyes:     General: Vision grossly intact. Gaze aligned appropriately.     Pupils: Pupils are equal, round, and reactive to light.  Neck:     Trachea: Trachea and phonation normal. No tracheal deviation.  Pulmonary:     Effort: Pulmonary effort is normal. No respiratory distress.  Abdominal:     General: There is no distension.     Palpations: Abdomen is soft.     Tenderness: There is generalized abdominal tenderness and tenderness in the periumbilical area and left lower quadrant. There is no guarding or rebound.       Comments: Laparotomy scar present, scant yellow/brown drainage present.  Minimal surrounding erythema.  Musculoskeletal:        General: Normal range of motion.     Cervical back: Normal range of motion.  Skin:    General: Skin is warm and dry.  Neurological:     Mental Status: She is alert.     GCS: GCS eye subscore is 4. GCS verbal subscore is 5. GCS motor subscore is 6.     Comments: Speech is clear and goal oriented, follows commands Major Cranial nerves without deficit, no facial droop Moves extremities without ataxia, coordination intact  Psychiatric:        Behavior: Behavior normal.     ED Results / Procedures / Treatments   Labs (all labs ordered are listed, but only abnormal results are displayed) Labs Reviewed  CBC WITH DIFFERENTIAL/PLATELET - Abnormal; Notable for the following components:      Result Value   Hemoglobin 9.8 (*)    HCT 32.0 (*)    MCV 79.8 (*)    MCH 24.4 (*)    RDW 16.7 (*)    Platelets 541 (*)    Abs Immature Granulocytes 0.10 (*)    All other components within normal limits  COMPREHENSIVE METABOLIC PANEL - Abnormal; Notable for the following components:   Glucose, Bld 105 (*)    Albumin 3.1 (*)    All other components within normal limits  URINALYSIS, ROUTINE W REFLEX MICROSCOPIC - Abnormal; Notable for the following components:   Specific Gravity, Urine 1.035 (*)    All other components within normal limits  I-STAT  CHEM 8, ED - Abnormal; Notable for the following components:   Potassium 3.4 (*)    Chloride 96 (*)    Glucose, Bld 103 (*)    Calcium, Ion 1.14 (*)    Hemoglobin 10.2 (*)    HCT 30.0 (*)    All other components within normal limits  RESPIRATORY PANEL BY RT PCR (FLU A&B, COVID)  CULTURE, BLOOD (ROUTINE X 2)  CULTURE, BLOOD (ROUTINE X 2)  LIPASE, BLOOD  LACTIC ACID, PLASMA  PREGNANCY, URINE  CBC  BASIC METABOLIC PANEL  I-STAT BETA HCG BLOOD, ED ([facility], [facility], [facility] ONLY)    EKG None  Radiology CT ABDOMEN PELVIS W  CONTRAST  Result Date: 01/17/2020 CLINICAL DATA:  Status post laparotomy for bowel obstruction, 12/28/2019, nausea, vomiting EXAM: CT ABDOMEN AND PELVIS WITH CONTRAST TECHNIQUE: Multidetector CT imaging of the abdomen and pelvis was performed using the standard protocol following bolus administration of intravenous contrast. CONTRAST:  187mL OMNIPAQUE IOHEXOL 300 MG/ML SOLN, additional oral enteric contrast COMPARISON:  12/23/2019 FINDINGS: Lower chest: Bibasilar scarring and or atelectasis. Hepatobiliary: No focal liver abnormality is seen. Status post cholecystectomy. No biliary dilatation. Pancreas: Unremarkable. No pancreatic ductal dilatation or surrounding inflammatory changes. Spleen: Normal in size without significant abnormality. Adrenals/Urinary Tract: Adrenal glands are unremarkable. Kidneys are normal, without renal calculi, solid lesion, or hydronephrosis. Bladder is unremarkable. Stomach/Bowel: Stomach is within normal limits. Appendix appears normal. Postoperative findings of distal small bowel resection and reanastomosis without evidence of recurrent obstruction (series 3, image 60). Fat stranding about the rectum (series 3, image 77, series 7, image 68). Vascular/Lymphatic: No significant vascular findings are present. No enlarged abdominal or pelvic lymph nodes. Reproductive: Fluid in the endometrial cavity. Other: Midline ventral laparotomy wound, which contains a fluid  collection external to the abdominal wall at the inferior aspect of the wound, measuring approximately 2.5 x 1.9 x 2.4 cm (series 3, image 60, series 7, image 75). There is a small, rim enhancing fluid collection in the low pelvis between the rectum and vagina/cervix, measuring approximately 1.1 cm (series 3, image 77). Musculoskeletal: No acute or significant osseous findings. IMPRESSION: 1. Fat stranding about the rectum (series 3, image 77, series 7, image 68). There is a small, rim enhancing fluid collection in the low pelvis between the rectum and vagina/cervix, measuring approximately 1.1 cm (series 3, image 77). Findings are suspicious for diverticulitis complicated by abscess. 2. Midline ventral laparotomy wound, which contains a fluid collection external to the abdominal wall at the inferior aspect of the wound, measuring approximately 2.5 x 1.9 x 2.4 cm (series 3, image 60, series 7, image 75). This is nonspecific and may reflect postoperative hematoma/seroma, or alternately abscess. The presence or absence of infection is not established by CT. 3. Postoperative findings of distal small bowel resection and reanastomosis without evidence of recurrent obstruction. These results will be called to the ordering clinician or representative by the Radiologist Assistant, and communication documented in the PACS or zVision Dashboard. Electronically Signed   By: Eddie Candle M.D.   On: 01/17/2020 16:03    Procedures Procedures (including critical care time)  Medications Ordered in ED Medications  acetaminophen (TYLENOL) tablet 650 mg (has no administration in time range)  oxyCODONE (Oxy IR/ROXICODONE) immediate release tablet 5 mg (has no administration in time range)  ondansetron (ZOFRAN-ODT) disintegrating tablet 4 mg (has no administration in time range)  polyethylene glycol (MIRALAX / GLYCOLAX) packet 17 g (has no administration in time range)  methocarbamol (ROBAXIN) tablet 750 mg (has no  administration in time range)  sodium chloride 0.9 % bolus 500 mL (0 mLs Intravenous Stopped 01/17/20 2006)  piperacillin-tazobactam (ZOSYN) IVPB 3.375 g (0 g Intravenous Stopped 01/17/20 2006)  morphine 2 MG/ML injection 2 mg (2 mg Intravenous Given 01/17/20 1825)  morphine 4 MG/ML injection 4 mg (4 mg Intravenous Given 01/17/20 2048)    ED Course  I have reviewed the triage vital signs and the nursing notes.  Pertinent labs & imaging results that were available during my care of the patient were reviewed by me and considered in my medical decision making (see chart for details).  Clinical Course as of Jan 17 2156  Wed Jan 17, 2020  1808 Dr. Donell Beers   [BM]    Clinical Course User Index [BM] Elizabeth Palau   MDM Rules/Calculators/A&P                     Chart review of CT abdomen pelvis from today:EXAM: CT ABDOMEN AND PELVIS WITH CONTRAST  TECHNIQUE: Multidetector CT imaging of the abdomen and pelvis was performed using the standard protocol following bolus administration of intravenous contrast.  CONTRAST:  OMNIPAQUE IOHEXOL 300 MG/ML SOLN, additional oral enteric contrast  COMPARISON:  12/23/2019  FINDINGS: Lower chest: Bibasilar scarring and or atelectasis.  Hepatobiliary: No focal liver abnormality is seen. Status post cholecystectomy. No biliary dilatation.  Pancreas: Unremarkable. No pancreatic ductal dilatation or surrounding inflammatory changes.  Spleen: Normal in size without significant abnormality.  Adrenals/Urinary Tract: Adrenal glands are unremarkable. Kidneys are normal, without renal calculi, solid lesion, or hydronephrosis. Bladder is unremarkable.  Stomach/Bowel: Stomach is within normal limits. Appendix appears normal. Postoperative findings of distal small bowel resection and reanastomosis without evidence of recurrent obstruction (series 3, image 60). Fat stranding about the rectum (series 3, image 77, series 7, image  68).  Vascular/Lymphatic: No significant vascular findings are present. No enlarged abdominal or pelvic lymph nodes.  Reproductive: Fluid in the endometrial cavity.  Other: Midline ventral laparotomy wound, which contains a fluid collection external to the abdominal wall at the inferior aspect of the wound, measuring approximately 2.5 x 1.9 x 2.4 cm (series 3, image 60, series 7, image 75). There is a small, rim enhancing fluid collection in the low pelvis between the rectum and vagina/cervix, measuring approximately 1.1 cm (series 3, image 77).  Musculoskeletal: No acute or significant osseous findings.  IMPRESSION: 1. Fat stranding about the rectum (series 3, image 77, series 7, image 68). There is a small, rim enhancing fluid collection in the low pelvis between the rectum and vagina/cervix, measuring approximately 1.1 cm (series 3, image 77). Findings are suspicious for diverticulitis complicated by abscess. 2. Midline ventral laparotomy wound, which contains a fluid collection external to the abdominal wall at the inferior aspect of the wound, measuring approximately 2.5 x 1.9 x 2.4 cm (series 3, image 60, series 7, image 75). This is nonspecific and may reflect postoperative hematoma/seroma, or alternately abscess. The presence or absence of infection is not established by CT. 3. Postoperative findings of distal small bowel resection and reanastomosis without evidence of recurrent obstruction.  These results will be called to the ordering clinician or representative by the Radiologist Assistant, and communication documented in the PACS or zVision Dashboard.   Electronically Signed   By: Lauralyn Primes M.D.   On: 01/17/2020 16:03 ======================================== On evaluation patient does not appear septic, she does report recent Tylenol use, afebrile here no tachycardia, hypotension, tachypnea or hypoxia on room air.  Abdomen is moderately tender to  palpation around her incision, periumbilical and left lower quadrant.  She has scant yellow/brown drainage from her wound with minimal erythema.  Will obtain blood work and begin patient on IV fluids and Zosyn at this time for concern of postoperative infection or diverticulitis.  Consult placed to general surgery. - 6:08 PM: Discussed case with Dr. Donell Beers from general surgery who agrees with IV Zosyn, has requested 2-hour Covid test and will see patient. - Patient reassessed resting comfortably no acute distress vital signs stable states understanding of care plan and is agreeable for general surgery evaluation. - CBC hemoglobin 9.8, improved from prior,  nonacute CMP nonacute Lactic 1.4 I-STAT Chem-8 nonacute Covid/flu panel negative Urine pregnancy negative - Patient reassessed multiple times resting comfortably no acute distress, vital signs stable without tachycardia hypotension or hypoxia on room air.  She has received pain medication, fluids and Zosyn. - Patient seen and evaluated by Dr. Donell BeersByerly, patient has been admitted to surgery service for further evaluation management.  Case was discussed with Dr. Rubin PayorPickering during this visit.  Note: Portions of this report may have been transcribed using voice recognition software. Every effort was made to ensure accuracy; however, inadvertent computerized transcription errors may still be present. Final Clinical Impression(s) / ED Diagnoses Final diagnoses:  Wound infection after surgery    Rx / DC Orders ED Discharge Orders    None       Elizabeth PalauMorelli, Whittley Carandang A, PA-C 01/17/20 2158    Elizabeth PalauMorelli, Bellamie Turney A, PA-C 01/17/20 2159    Benjiman CorePickering, Nathan, MD 01/17/20 2326

## 2020-01-17 NOTE — H&P (Signed)
Linda Vang is an 39 y.o. female.   Chief Complaint: abdominal pain and fever/night sweats.   HPI:  Pt is a 39 yo F s/p ex lap/LOA 12/31 by Dr. Janee Mornhompson for a small bowel obstruction.  She was discharged 1/6.  Over the last two weeks, she has come to the office 2-3 times.  When she had her staples removed (1/15), she had some drainage and had a wick placed in a small hole in the top portion of her wound.  She was placed on antibiotics at that time.  Her sister removed the wick on 1/17.  Despite this and being on antibiotics, she has had worsening abdominal pain and fevers/night sweats.  She denies nausea/vomiting.  She is continuing to pass gas and have bowel movements.  She had an outpatient  CT and was referred to the ED. She describes the pain as worse in the lower portion of the incision and going into the left lower quadrant and left flank.     History reviewed. No pertinent past medical history.  Past Surgical History:  Procedure Laterality Date  . BOWEL RESECTION N/A 12/28/2019   Procedure: Small Bowel Resection;  Surgeon: Violeta Gelinashompson, Burke, MD;  [facility];  Service: General;  Laterality: N/A;  . CESAREAN SECTION    . CHOLECYSTECTOMY    . LAPAROTOMY N/A 12/28/2019   Procedure: EXPLORATORY LAPAROTOMY;  Surgeon: Violeta Gelinashompson, Burke, MD;  [facility];  Service: General;  Laterality: N/A;  . LYSIS OF ADHESION N/A 12/28/2019   Procedure: Lysis Of Adhesion;  Surgeon: Violeta Gelinashompson, Burke, MD;  [facility];  Service: General;  Laterality: N/A;    No family history on file. Social History:  reports that she has never smoked. She has never used smokeless tobacco. She reports that she does not drink alcohol or use drugs.  Allergies: No Known Allergies  Current Meds  Medication Sig  . acetaminophen (TYLENOL) 325 MG tablet Take 2 tablets (650 mg total) by mouth every 6 (six) hours as needed.  . docusate sodium (COLACE) 100 MG capsule Take 1 capsule (100 mg total) by mouth 2 (two)  times daily as needed for mild constipation.  . methocarbamol (ROBAXIN) 750 MG tablet Take 1 tablet (750 mg total) by mouth every 6 (six) hours as needed for muscle spasms.  . ondansetron (ZOFRAN-ODT) 4 MG disintegrating tablet Take 1 tablet (4 mg total) by mouth every 6 (six) hours as needed for nausea.  Marland Kitchen. oxyCODONE (OXY IR/ROXICODONE) 5 MG immediate release tablet Take 1 tablet (5 mg total) by mouth every 6 (six) hours as needed for breakthrough pain.  . polyethylene glycol (MIRALAX / GLYCOLAX) 17 g packet Take 17 g by mouth daily as needed for mild constipation.     Results for orders placed or performed during the hospital encounter of 01/17/20 (from the past 48 hour(s))  Respiratory Panel by RT PCR (Flu A&B, Covid) - Nasopharyngeal Swab     Status: None   Collection Time: 01/17/20  6:14 PM   Specimen: Nasopharyngeal Swab  Result Value Ref Range   SARS Coronavirus 2 by RT PCR NEGATIVE NEGATIVE    Comment: (NOTE) SARS-CoV-2 target nucleic acids are NOT DETECTED. The SARS-CoV-2 RNA is generally detectable in upper respiratoy specimens during the acute phase of infection. The lowest concentration of SARS-CoV-2 viral copies this assay can detect is 131 copies/mL. A negative result does not preclude SARS-Cov-2 infection and should not be used as the sole basis for treatment or other patient management  decisions. A negative result may occur with  improper specimen collection/handling, submission of specimen other than nasopharyngeal swab, presence of viral mutation(s) within the areas targeted by this assay, and inadequate number of viral copies (<131 copies/mL). A negative result must be combined with clinical observations, patient history, and epidemiological information. The expected result is Negative. Fact Sheet for Patients:  PinkCheek.be Fact Sheet for Healthcare Providers:  GravelBags.it This test is not yet ap proved or  cleared by the [place] FDA and  has been authorized for detection and/or diagnosis of SARS-CoV-2 by FDA under an Emergency Use Authorization (EUA). This EUA will remain  in effect (meaning this test can be used) for the duration of the COVID-19 declaration under Section 564(b)(1) of the Act, 21 U.S.C. section 360bbb-3(b)(1), unless the authorization is terminated or revoked sooner.    Influenza A by PCR NEGATIVE NEGATIVE   Influenza B by PCR NEGATIVE NEGATIVE    Comment: (NOTE) The Xpert Xpress SARS-CoV-2/FLU/RSV assay is intended as an aid in  the diagnosis of influenza from Nasopharyngeal swab specimens and  should not be used as a sole basis for treatment. Nasal washings and  aspirates are unacceptable for Xpert Xpress SARS-CoV-2/FLU/RSV  testing. Fact Sheet for Patients: PinkCheek.be Fact Sheet for Healthcare Providers: GravelBags.it This test is not yet approved or cleared by the [place] FDA and  has been authorized for detection and/or diagnosis of SARS-CoV-2 by  FDA under an Emergency Use Authorization (EUA). This EUA will remain  in effect (meaning this test can be used) for the duration of the  Covid-19 declaration under Section 564(b)(1) of the Act, 21  U.S.C. section 360bbb-3(b)(1), unless the authorization is  terminated or revoked. Performed at [facility], [place] [address], [place], [place] [address]   CBC with Differential     Status: Abnormal   Collection Time: 01/17/20  6:21 PM  Result Value Ref Range   WBC 6.7 4.0 - 10.5 K/uL   RBC 4.01 3.87 - 5.11 MIL/uL   Hemoglobin 9.8 (L) 12.0 - 15.0 g/dL   HCT 32.0 (L) 36.0 - 46.0 %   MCV 79.8 (L) 80.0 - 100.0 fL   MCH 24.4 (L) 26.0 - 34.0 pg   MCHC 30.6 30.0 - 36.0 g/dL   RDW 16.7 (H) 11.5 - 15.5 %   Platelets 541 (H) 150 - 400 K/uL   nRBC 0.0 0.0 - 0.2 %   Neutrophils Relative % 46 %   Neutro Abs 3.1 1.7 - 7.7 K/uL   Lymphocytes  Relative 43 %   Lymphs Abs 2.9 0.7 - 4.0 K/uL   Monocytes Relative 7 %   Monocytes Absolute 0.5 0.1 - 1.0 K/uL   Eosinophils Relative 1 %   Eosinophils Absolute 0.1 0.0 - 0.5 K/uL   Basophils Relative 1 %   Basophils Absolute 0.0 0.0 - 0.1 K/uL   Immature Granulocytes 2 %   Abs Immature Granulocytes 0.10 (H) 0.00 - 0.07 K/uL    Comment: Performed at [facility], [place] [address] [place], [place] [address]  Comprehensive metabolic panel     Status: Abnormal   Collection Time: 01/17/20  6:21 PM  Result Value Ref Range   Sodium 136 135 - 145 mmol/L   Potassium 3.5 3.5 - 5.1 mmol/L   Chloride 98 98 - 111 mmol/L   CO2 28 22 - 32 mmol/L   Glucose, Bld 105 (H) 70 - 99 mg/dL   BUN 8 6 - 20 mg/dL  Creatinine, Ser 0.68 0.44 - 1.00 mg/dL   Calcium 9.0 8.9 - 68.1 mg/dL   Total Protein 7.2 6.5 - 8.1 g/dL   Albumin 3.1 (L) 3.5 - 5.0 g/dL   AST 22 15 - 41 U/L   ALT 36 0 - 44 U/L   Alkaline Phosphatase 114 38 - 126 U/L   Total Bilirubin 0.9 0.3 - 1.2 mg/dL   GFR calc non Af Amer >60 >60 mL/min   GFR calc Af Amer >60 >60 mL/min   Anion gap 10 5 - 15    Comment: Performed at [facility], [address], [place], [place] [address]  Lipase, blood     Status: None   Collection Time: 01/17/20  6:21 PM  Result Value Ref Range   Lipase 27 11 - 51 U/L    Comment: Performed at [facility], [address], [place], [place] [address]  Lactic acid, plasma     Status: None   Collection Time: 01/17/20  6:21 PM  Result Value Ref Range   Lactic Acid, Venous 1.4 0.5 - 1.9 mmol/L    Comment: Performed at [facility], [address], [place], [place] [address]  I-stat chem 8, ED (not at [facility] or [facility])     Status: Abnormal   Collection Time: 01/17/20  6:35 PM  Result Value Ref Range   Sodium 136 135 - 145 mmol/L   Potassium 3.4 (L) 3.5 - 5.1 mmol/L   Chloride 96 (L) 98 - 111 mmol/L   BUN 6 6 - 20 mg/dL   Creatinine, Ser 7.59  0.44 - 1.00 mg/dL   Glucose, Bld 163 (H) 70 - 99 mg/dL   Calcium, Ion 8.46 (L) 1.15 - 1.40 mmol/L   TCO2 28 22 - 32 mmol/L   Hemoglobin 10.2 (L) 12.0 - 15.0 g/dL   HCT 65.9 (L) 93.5 - 70.1 %  Urinalysis, Routine w reflex microscopic     Status: Abnormal   Collection Time: 01/17/20  8:01 PM  Result Value Ref Range   Color, Urine YELLOW YELLOW   APPearance CLEAR CLEAR   Specific Gravity, Urine 1.035 (H) 1.005 - 1.030   pH 6.0 5.0 - 8.0   Glucose, UA NEGATIVE NEGATIVE mg/dL   Hgb urine dipstick NEGATIVE NEGATIVE   Bilirubin Urine NEGATIVE NEGATIVE   Ketones, ur NEGATIVE NEGATIVE mg/dL   Protein, ur NEGATIVE NEGATIVE mg/dL   Nitrite NEGATIVE NEGATIVE   Leukocytes,Ua NEGATIVE NEGATIVE    Comment: Performed at [facility], [address], [place], [place] [address]  Pregnancy, urine     Status: None   Collection Time: 01/17/20  8:43 PM  Result Value Ref Range   Preg Test, Ur NEGATIVE NEGATIVE    Comment:        THE SENSITIVITY OF THIS METHODOLOGY IS >20 mIU/mL. Performed at [facility], [address], [place], [place] [address]    CT ABDOMEN PELVIS W CONTRAST  Result Date: 01/17/2020 CLINICAL DATA:  Status post laparotomy for bowel obstruction, 12/28/2019, nausea, vomiting EXAM: CT ABDOMEN AND PELVIS WITH CONTRAST TECHNIQUE: Multidetector CT imaging of the abdomen and pelvis was performed using the standard protocol following bolus administration of intravenous contrast. CONTRAST:  OMNIPAQUE IOHEXOL 300 MG/ML SOLN, additional oral enteric contrast COMPARISON:  12/23/2019 FINDINGS: Lower chest: Bibasilar scarring and or atelectasis. Hepatobiliary: No focal liver abnormality is seen. Status post cholecystectomy. No biliary dilatation. Pancreas: Unremarkable. No pancreatic ductal dilatation or surrounding inflammatory changes.  Spleen: Normal in size without significant abnormality. Adrenals/Urinary Tract: Adrenal glands are unremarkable. Kidneys  are normal, without renal calculi, solid lesion, or hydronephrosis. Bladder is unremarkable. Stomach/Bowel: Stomach is within normal limits. Appendix appears normal. Postoperative findings of distal small bowel resection and reanastomosis without evidence of recurrent obstruction (series 3, image 60). Fat stranding about the rectum (series 3, image 77, series 7, image 68). Vascular/Lymphatic: No significant vascular findings are present. No enlarged abdominal or pelvic lymph nodes. Reproductive: Fluid in the endometrial cavity. Other: Midline ventral laparotomy wound, which contains a fluid collection external to the abdominal wall at the inferior aspect of the wound, measuring approximately 2.5 x 1.9 x 2.4 cm (series 3, image 60, series 7, image 75). There is a small, rim enhancing fluid collection in the low pelvis between the rectum and vagina/cervix, measuring approximately 1.1 cm (series 3, image 77). Musculoskeletal: No acute or significant osseous findings. IMPRESSION: 1. Fat stranding about the rectum (series 3, image 77, series 7, image 68). There is a small, rim enhancing fluid collection in the low pelvis between the rectum and vagina/cervix, measuring approximately 1.1 cm (series 3, image 77). Findings are suspicious for diverticulitis complicated by abscess. 2. Midline ventral laparotomy wound, which contains a fluid collection external to the abdominal wall at the inferior aspect of the wound, measuring approximately 2.5 x 1.9 x 2.4 cm (series 3, image 60, series 7, image 75). This is nonspecific and may reflect postoperative hematoma/seroma, or alternately abscess. The presence or absence of infection is not established by CT. 3. Postoperative findings of distal small bowel resection and reanastomosis without evidence of recurrent obstruction. These results will be called to the ordering clinician or representative by the Radiologist Assistant, and communication documented in the PACS or zVision  Dashboard. Electronically Signed   By: Lauralyn Primes M.D.   On: 01/17/2020 16:03    Review of Systems  Constitutional: Positive for fever.       Night sweats  HENT: Negative.   Eyes: Negative.   Respiratory: Negative.   Cardiovascular: Negative.   Gastrointestinal: Positive for abdominal pain. Negative for anal bleeding, blood in stool, constipation, diarrhea, nausea and vomiting.       Wound drainage  Endocrine: Negative.   Genitourinary: Negative.   Musculoskeletal: Negative.   Allergic/Immunologic: Negative.   Neurological: Negative.   Hematological: Negative.   Psychiatric/Behavioral: Negative.     Blood pressure 106/73, pulse 82, temperature 98.5 F (36.9 C), temperature source Oral, resp. rate 18, last menstrual period 12/28/2019, SpO2 95 %. Physical Exam  Constitutional: She is oriented to person, place, and time. She appears well-developed and well-nourished. She appears distressed (looks uncomfortable).  HENT:  Head: Normocephalic and atraumatic.  Right Ear: External ear normal.  Left Ear: External ear normal.  Mouth/Throat: Oropharynx is clear and moist.  Eyes: Pupils are equal, round, and reactive to light. Conjunctivae are normal. No scleral icterus.  Neck: No thyromegaly present.  Cardiovascular: Normal rate, regular rhythm and intact distal pulses.  Respiratory: Effort normal. No respiratory distress.  GI: Soft. She exhibits no distension and no mass. There is no rebound and no guarding.    Midline wound almost completely healed over.  Punctate opening above umbilicus with thin drainage.  Lower portion with skin closed over with erythema and tenderness.    Musculoskeletal:        General: No tenderness, deformity or edema. Normal range of motion.     Cervical back: Neck supple.  Neurological: She is alert and  oriented to person, place, and time. Coordination normal.  Skin: Skin is warm and dry. No rash noted. No erythema. No pallor.  Psychiatric: She has a  normal mood and affect. Her behavior is normal. Judgment and thought content normal.     Assessment/Plan H/o SBO with ex lap/LOA 12/28/19 Wound infection  Small fluid collection in pelvis  Likely the pelvic fluid collection is remnant of surgery. Will need lower portion of the wound opened up.  It is healed over on top, so she will need local and incision. Will do IV antibiotics given the fact that she has remained febrile on oral antibiotics. Will give diet. Continue stool softener.s Pain control Nausea meds as needed.     Almond Lint, MD 01/17/2020, 9:49 PM

## 2020-01-17 NOTE — ED Notes (Signed)
ED Provider at bedside. Awaiting consult from general surgery

## 2020-01-17 NOTE — ED Notes (Signed)
Report called to [place], RN on [phone] No questions at this time, will prepare patient for transport to floor

## 2020-01-17 NOTE — ED Notes (Signed)
Pt ambulated to RR without assistance.  Gait was steady.

## 2020-01-18 LAB — CBC
HCT: 29.7 % — ABNORMAL LOW (ref 36.0–46.0)
Hemoglobin: 9 g/dL — ABNORMAL LOW (ref 12.0–15.0)
MCH: 24.6 pg — ABNORMAL LOW (ref 26.0–34.0)
MCHC: 30.3 g/dL (ref 30.0–36.0)
MCV: 81.1 fL (ref 80.0–100.0)
Platelets: 471 10*3/uL — ABNORMAL HIGH (ref 150–400)
RBC: 3.66 MIL/uL — ABNORMAL LOW (ref 3.87–5.11)
RDW: 16.9 % — ABNORMAL HIGH (ref 11.5–15.5)
WBC: 5.6 10*3/uL (ref 4.0–10.5)
nRBC: 0 % (ref 0.0–0.2)

## 2020-01-18 LAB — BASIC METABOLIC PANEL
Anion gap: 10 (ref 5–15)
BUN: 10 mg/dL (ref 6–20)
CO2: 28 mmol/L (ref 22–32)
Calcium: 9.1 mg/dL (ref 8.9–10.3)
Chloride: 99 mmol/L (ref 98–111)
Creatinine, Ser: 0.52 mg/dL (ref 0.44–1.00)
GFR calc Af Amer: >60 mL/min (ref >60)
GFR calc non Af Amer: >60 mL/min (ref >60)
Glucose, Bld: 115 mg/dL — ABNORMAL HIGH (ref 70–99)
Potassium: 4 mmol/L (ref 3.5–5.1)
Sodium: 137 mmol/L (ref 135–145)

## 2020-01-18 MED ORDER — AMOXICILLIN-POT CLAVULANATE 875-125 MG PO TABS: 1.0000 | ORAL_TABLET | Freq: Two times a day (BID) | ORAL | 0 refills | Status: AC

## 2020-01-18 MED ORDER — LIDOCAINE HCL 1 % IJ SOLN
20.0000 mL | Freq: Once | INTRAMUSCULAR | Status: DC
  Filled 2020-01-18: qty 20

## 2020-01-18 NOTE — Progress Notes (Signed)
Wound care teaching was done with Pt's daughter, she verbalized understanding. All wound care materials were provided.

## 2020-01-18 NOTE — Plan of Care (Signed)
Pt is discharged.

## 2020-01-18 NOTE — Procedures (Signed)
Incision and Drainage Procedure Note  Pre-operative Diagnosis: Post-operative wound infection  Post-operative Diagnosis: same  Indications: as above  Anesthesia: 1% plain lidocaine  Procedure Details  The procedure, risks and complications have been discussed in detail (including, but not limited to airway compromise, infection, bleeding) with the patient, and the patient has signed consent to the procedure.  The skin was sterilely prepped and draped over the affected area in the usual fashion. After adequate local anesthesia, I&D with a #11 blade was performed on the midline incision inferiorly and extending area of drainage superiorly. Purulent drainage: absent. Thin bloody fluid evacuated and wounds irrigated with sterile saline. Wounds then packed with 1/4" iodoform packing strips.  The patient was observed until stable.  Findings: Minimal thin bloody fluid evacuated. Superior wound with shallow tract extending toward umbilicus. Inferior wound with tract extending upwards toward umbilicus.  EBL: <5 cc's  Drains: None   Condition: Tolerated procedure well and Stable   Complications: none.  Wells Guiles , [facility] 01/18/2020, 9:27 AM Please see Amion for pager number during day hours 7:00am-4:30pm

## 2020-01-18 NOTE — Discharge Instructions (Signed)
Infeccin en las heridas Wound Infection Una infeccin ocurre cuando los grmenes comienzan a desarrollarse en una herida. Las bacterias causan la mayora de las infecciones en las heridas. Tambin pueden ocurrir otro tipo de infecciones. Una infeccin puede hacer que la herida se abra. Este tipo de infeccin Insurance underwriter. Si una infeccin en una herida no se trata, puede haber complicaciones. Cules son las causas?  La mayora de las veces la causa son grmenes (bacterias) que se desarrollan en una herida.  Otros grmenes, como cndidas y hongos, tambin pueden causar infecciones en las heridas. Qu incrementa el riesgo?  Tener debilitamiento del sistema de defensa del cuerpo (sistema inmunitario).  Tener diabetes.  Tomar determinados medicamentos (corticoesteroides) durante un perodo prologando.  Fumar.  Ser Neomia Dear persona de edad avanzada.  Tener sobrepeso.  Tomar ciertos medicamentos para el tratamiento para Management consultant. Cules son los signos o los sntomas?  Ms enrojecimiento, hinchazn o Art therapist de la herida.  Ms Montez Hageman o lquido en el lugar de la herida.  Olor ftido que proviene de la herida o de la venda (vendaje).  Grant Ruts.  Mucho cansancio.  Calor en la herida o alrededor de esta.  Tener pus en el lugar de la herida. Cmo se trata?  Esta afeccin se trata frecuentemente con un medicamento antibitico. ? La infeccin puede mejorar entre 24 y 48 horas despus de [employer] antibiticos. ? Despus de 24 a 48 horas, el enrojecimiento alrededor de la herida debera dejar de extenderse. La herida tambin debera ser menos dolorosa. Siga estas instrucciones en su casa: Medicamentos  Tome o aplique los medicamentos de venta libre y los recetados solamente como se lo haya indicado el mdico.  Si le recetaron un antibitico, tmelo o aplquelo como se lo haya indicado el mdico. No deje de usar el antibitico aunque comience a Restaurant manager, fast food. Cuidados de la herida   Limpie la herida una vez al da o como se lo haya indicado el mdico. ? Lave la herida con agua y [place]. ? Enjuguela con agua para quitar todo el jabn. ? Squela dando palmaditas con una toalla limpia. No la frote.  Siga las indicaciones del mdico en lo que respecta al cuidado de la herida. Asegrese de hacer lo siguiente: ? Lvese las manos con agua y [place] antes y despus de cambiarse la venda. Use un desinfectante para manos si no dispone de [place] y [place]. ? Cambie las [employer] se lo haya indicado el mdico. ? Deje los puntos (suturas), la goma para cerrar la piel o tiras para adherir a la piel Estate agent) en su lugar si la herida se ha cerrado. Tal vez deban dejarse puestos en la piel durante 2semanas o ms. Si las tiras [place] se despegan y se enroscan, puede recortar los bordes sueltos. No retire las tiras [employer] por completo a menos que el mdico lo autorice. Algunas heridas se dejan abiertas para que se curen por s solas.  Controle la herida [employer] para detectar signos de infeccin. Est atento a lo siguiente: ? Aumento del enrojecimiento, la hinchazn o Chief Technology Officer. ? Ms lquido [place]. ? Calor. ? Pus o mal olor. Indicaciones generales  Mantenga seco el vendaje hasta que el mdico le diga que se lo puede quitar.  No se d baos de inmersin, no nade ni use el jacuzzi hasta que el mdico lo apruebe. Pregunte al mdico si puede ducharse. Delle Reining solo le permitan darse baos de [place].  Cuando  est sentado o acostado, levante (eleve) la zona lesionada por encima del nivel del corazn.  No se rasque ni se toque la herida.  Concurra a todas las visitas de seguimiento como se lo haya indicado el mdico. Esto es importante. Comunquese con un mdico si:  Los medicamentos no Forensic psychologist.  Tiene ms enrojecimiento, hinchazn o dolor alrededor de la herida.  Le sale ms lquido o sangre de la herida.  La herida se  siente caliente al tacto.  Observa pus que proviene de la herida.  Percibe que sale mal olor de la herida o de la venda.  La herida estaba cerrada y se abre. Solicite ayuda inmediatamente si:  Tiene una lnea roja que sale de la herida.  Cristy Hilts. Resumen  Una infeccin ocurre cuando los grmenes comienzan a desarrollarse en una herida.  En general, esta afeccin se trata con un medicamento antibitico.  Siga las indicaciones del mdico en lo que respecta al cuidado de la herida.  Comunquese con un mdico si la infeccin de la herida no comienza a mejorar en 24 a 48 horas, o si los sntomas empeoran.  Concurra a todas las visitas de seguimiento como se lo haya indicado el mdico. Esto es importante. Esta informacin no tiene Marine scientist el consejo del mdico. Asegrese de hacerle al mdico cualquier pregunta que tenga. Document Revised: 09/08/2018 Document Reviewed: 09/08/2018 Elsevier Patient Education  2020 [place] e drenagem, cuidados aps o procedimento Incision and Drainage, Care After [facility] folheto oferece informaes sobre como cuidar de si aps o seu procedimento. Seu mdico tambm poder fornecer instrues mais especficas. Caso tenha problemas ou perguntas, entre em contato com o seu mdico. O que posso esperar aps o procedimento? Aps o procedimento,  comum que ocorra:  Dor ou desconforto ao redor do Technical sales engineer.  Sangue, lquido ou pus (secreo) saindo Production assistant, radio.  Vermelhido e pele rgida ao redor do local da inciso. Siga essas instrues em casa: Medicamentos  Tome medicamentos vendidos com ou sem receita mdica somente de acordo com as indicaes do seu mdico.  Caso tenha recebido uma prescrio de antibitico, use-o ou tome-o somente conforme a orientao do seu mdico. No pare de tomar o antibitico, mesmo se comear a se Warden/ranger. Como Careers information officer ferida Siga as instrues do seu mdico sobre como cuidar da [place].  Certifique-se de que voc:  Lave as mos com gua e sabo antes e depois de trocar sua gaze (curativo). Caso gua e sabo no estejam disponveis, use gel antissptico para as mos.  Troque o curativo ou enchimento conforme orientado pelo seu mdico. ? Se seu curativo Animator ou grudado quando tentar remov-lo, umedea ou molhe o curativo com soro fisiolgico ou gua para ele poder ser removido sem ferir a pele ou o tecido. ? Caso a ferida esteja com enchimento, deixe-o no lugar at seu mdico lhe dizer para remov-lo. Para remover o enchimento, umedea ou molhe-o com soro fisiolgico ou gua para que possa ser removido sem lesionar a pele ou os tecidos.  No remova os pontos (suturas), a cola cirrgica ou as faixas adesivas. Pode ser necessrio deixar esses fechamentos de ferida no lugar por 2 semanas ou mais. Caso as [employer] comecem a se soltar ou dobrar, voc pode cortar as extremidades soltas. No remova faixas adesivas completamente a menos que seu mdico lhe diga para fazer isso. Verifique a ferida todos os dias em busca de sinais de infeco. Verifique  a ocorrncia de:  Production manager vermelhido, do Film/video editor.  Aumento da quantidade de lquido ou sangue.  Calor.  Pus ou mau cheiro. Caso voc tenha ido para casa com um dreno, siga as instrues do seu mdico sobre:  [employer].  Como cuidar dele em casa.  Instrues gerais  Deixe a rea afetada em repouso.  No tome banhos de banheira, nade, nem use hidromassagem at [employer] aprovao do seu mdico. Pergunte ao seu mdico se voc pode tomar banhos de chuveiro. Talvez voc s possa tomar banho de esponja.  Retorne a suas atividades normais de acordo com as orientaes do seu mdico. Pergunte ao seu mdico quais atividades so seguras para voc. Seu mdico poder orientar restries de certas atividades ou de [employer].  A inciso continuar a drenar.  normal ter alguma secreo clara ou com um  pouco de sangue. A quantidade de secreo deve diminuir a cada dia.  No aplique cremes, pomadas ou lquidos a menos que orientado pelo seu mdico.  Comparea a todas as consultas de acompanhamento de acordo com as orientaes do seu mdico. Isso  importante. Entre em contato com um mdico se:  Seu cisto ou abscesso retornarem.  Tiver febre ou calafrios.  Notar mais vermelhido, inchao ou dor na rea da inciso.  Notar mais lquido ou sangue saindo da inciso.  A inciso parecer quente ao toque.  Houver pus ou mau cheiro saindo [employer].  Surgirem listras vermelhas acima ou abaixo do Product manager inciso. Tomasa Hose ajuda imediatamente se:  Tiver dor ou sangramento intensos.  No conseguir comer ou beber sem vomitar.  Ocorrer diminuio no volume de urina.  Tiver dificuldade para respirar.  Tiver dor no peito.  Tossir sangue.  A rea afetada ficar dormente ou comear a formigar. Esses sintomas podem representar um problema srio e ser [employer]. No espere para ver se os sintomas desaparecem. Procure um mdico imediatamente. Ligue para o nmero de Technical brewer (911, nos EUA). No dirija por conta prpria at o hospital. Resumo  Aps esse procedimento,  comum ter lquido, sangue ou pus saindo do local da cirurgia.  Siga todas as instrues de cuidados em casa. Voc ser orientado sobre como cuidar da inciso, como verificar se est infeccionada e como tomar [employer].  Caso tenha recebido uma prescrio de antibitico, tome-o somente conforme a orientao do seu mdico. No pare de tomar o antibitico mesmo se comear a se Passenger transport manager.  Entre em contato com um mdico se houver Production manager vermelhido, do Science writer dor em torno do Company secretary. Procure atendimento imediatamente se voc sentir dor no peito, vomitar, tossir sangue ou se tiver falta de ar.  Comparea a todas as consultas de acompanhamento de acordo com as orientaes do seu mdico. Isso   importante. Estas informaes no se destinam a substituir as recomendaes de seu mdico. No deixe de discutir quaisquer dvidas com seu mdico. Document Revised: 12/12/2018 Document Reviewed: 12/12/2018 Elsevier Patient Education  2020 [employer].

## 2020-01-18 NOTE — Progress Notes (Addendum)
Patient requested to talk with CSW. CSW used interpreting services [place] [phone]. Patient had questions about her [employer] and current hospital bill. Patient reports she recently moved from [place] and while there received medicaid benefits. Patient reports her medicaid was active until 12/28/2019. She reports she reached out to DSS for guidance and they informed her she would need to reach out to [place] and have them "close the case." Patient reports she faxed all requested documents to [facility].  She reports she has not heard anything from them. Patient requested the number to [facility] program.  CSW provided patient with spanish print out of [facility] patient financial assistance contact information and paper application. Patient appreciative of CSW intervention.   Per chart review the financial counselor- Axel Filler has tried to reach out to the patient. Per the patient she does not have a voicemail on her phone from anyone. Patient telephone number is [phone]. The wrong number was listed in the chart. CSW updated the contact information to reflect this number.

## 2020-01-18 NOTE — Discharge Summary (Signed)
[facility] Discharge Summary   Patient ID: Linda Vang MRN: [id number] DOB/AGE: 09-06-1981 38 y.o.  Admit date: 01/17/2020 Discharge date: 01/18/2020  Admitting Diagnosis: Post-operative wound infection   Discharge Diagnosis Post-operative wound infection   Consultants None   Imaging: CT ABDOMEN PELVIS W CONTRAST  Result Date: 01/17/2020 CLINICAL DATA:  Status post laparotomy for bowel obstruction, 12/28/2019, nausea, vomiting EXAM: CT ABDOMEN AND PELVIS WITH CONTRAST TECHNIQUE: Multidetector CT imaging of the abdomen and pelvis was performed using the standard protocol following bolus administration of intravenous contrast. CONTRAST:  OMNIPAQUE IOHEXOL 300 MG/ML SOLN, additional oral enteric contrast COMPARISON:  12/23/2019 FINDINGS: Lower chest: Bibasilar scarring and or atelectasis. Hepatobiliary: No focal liver abnormality is seen. Status post cholecystectomy. No biliary dilatation. Pancreas: Unremarkable. No pancreatic ductal dilatation or surrounding inflammatory changes. Spleen: Normal in size without significant abnormality. Adrenals/Urinary Tract: Adrenal glands are unremarkable. Kidneys are normal, without renal calculi, solid lesion, or hydronephrosis. Bladder is unremarkable. Stomach/Bowel: Stomach is within normal limits. Appendix appears normal. Postoperative findings of distal small bowel resection and reanastomosis without evidence of recurrent obstruction (series 3, image 60). Fat stranding about the rectum (series 3, image 77, series 7, image 68). Vascular/Lymphatic: No significant vascular findings are present. No enlarged abdominal or pelvic lymph nodes. Reproductive: Fluid in the endometrial cavity. Other: Midline ventral laparotomy wound, which contains a fluid collection external to the abdominal wall at the inferior aspect of the wound, measuring approximately 2.5 x 1.9 x 2.4 cm (series 3, image 60, series 7, image 75). There is a small, rim  enhancing fluid collection in the low pelvis between the rectum and vagina/cervix, measuring approximately 1.1 cm (series 3, image 77). Musculoskeletal: No acute or significant osseous findings. IMPRESSION: 1. Fat stranding about the rectum (series 3, image 77, series 7, image 68). There is a small, rim enhancing fluid collection in the low pelvis between the rectum and vagina/cervix, measuring approximately 1.1 cm (series 3, image 77). Findings are suspicious for diverticulitis complicated by abscess. 2. Midline ventral laparotomy wound, which contains a fluid collection external to the abdominal wall at the inferior aspect of the wound, measuring approximately 2.5 x 1.9 x 2.4 cm (series 3, image 60, series 7, image 75). This is nonspecific and may reflect postoperative hematoma/seroma, or alternately abscess. The presence or absence of infection is not established by CT. 3. Postoperative findings of distal small bowel resection and reanastomosis without evidence of recurrent obstruction. These results will be called to the ordering clinician or representative by the Radiologist Assistant, and communication documented in the PACS or zVision Dashboard. Electronically Signed   By: Lauralyn Primes M.D.   On: 01/17/2020 16:03    Procedures Dr. Janee Morn (12/28/19) - exploratory laparotomy, LOA, small bowel resection Mj Willis Rayburn PA-C (01/18/20) - Bedside Incision and drainage  Hospital Course:  Patient is a 39 year old female s/p exploratory laparotomy 12/28/19 for sbo and discharged 01/03/20 who presented to [facility] from home with abdominal pain and fever.  Patient had been seen in the office and outpatient CT scan was done and showed fluid collection within subcutaneous tissues at inferior midline wound.  Patient was admitted and underwent procedure listed above. Patient with no leukocytosis or fevers at time of admission or since. Tolerated procedure well and was felt stable for discharge with wound care  instructions. She will be sent home on 1 week oral antibiotics. Patient will follow up in our office next week for wound check and knows to call with questions  or concerns. She will call to confirm appointment date/time.    Physical Exam: General:  Alert, NAD, pleasant, comfortable Abd:  Soft, ND, non-tender, midline incision with small open draining area superiorly with serous appearing drainage  Allergies as of 01/18/2020   No Known Allergies     Medication List    STOP taking these medications   ondansetron 4 MG disintegrating tablet Commonly known as: ZOFRAN-ODT   oxyCODONE 5 MG immediate release tablet Commonly known as: Oxy IR/ROXICODONE     TAKE these medications   acetaminophen 325 MG tablet Commonly known as: TYLENOL Take 2 tablets (650 mg total) by mouth every 6 (six) hours as needed.   amoxicillin-clavulanate 875-125 MG tablet Commonly known as: Augmentin Take 1 tablet by mouth every 12 (twelve) hours for 7 days.   docusate sodium 100 MG capsule Commonly known as: COLACE Take 1 capsule (100 mg total) by mouth 2 (two) times daily as needed for mild constipation.   methocarbamol 750 MG tablet Commonly known as: ROBAXIN Take 1 tablet (750 mg total) by mouth every 6 (six) hours as needed for muscle spasms.   polyethylene glycol 17 g packet Commonly known as: MIRALAX / GLYCOLAX Take 17 g by mouth daily as needed for mild constipation.        Follow-up Information    [facility] [place]. Go on 01/22/2020.   Specialty: General Surgery Why: Follow up appointment for wound check scheduled for 2:45 PM. Please arrive 15 min prior to appointment time. Bring photo ID and insurance information.  Contact information: [address] [place] [address] [phone]           Signed: Brigid Re, [facility] 01/18/2020, 9:35 AM Please see Amion for pager number during day hours 7:00am-4:30pm

## 2020-01-22 LAB — CULTURE, BLOOD (ROUTINE X 2)
Culture: NO GROWTH
Special Requests: ADEQUATE

## 2020-01-23 LAB — CULTURE, BLOOD (ROUTINE X 2): Culture: NO GROWTH

## 2020-01-25 LAB — SUSCEPTIBILITY RESULT

## 2020-01-25 LAB — SUSCEPTIBILITY, AER + ANAEROB

## 2020-02-06 LAB — AEROBIC CULTURE W GRAM STAIN (SUPERFICIAL SPECIMEN)

## 2021-03-26 IMAGING — DX DG ABD PORTABLE 1V
1 series · 1 of 1 positions shown · non-contrast
Comparison: Radiograph yesterday. CT 12/23/2019

CLINICAL DATA: Small bowel obstruction. 24 hour delay.

EXAM:
PORTABLE ABDOMEN - 1 VIEW

[abdomen]
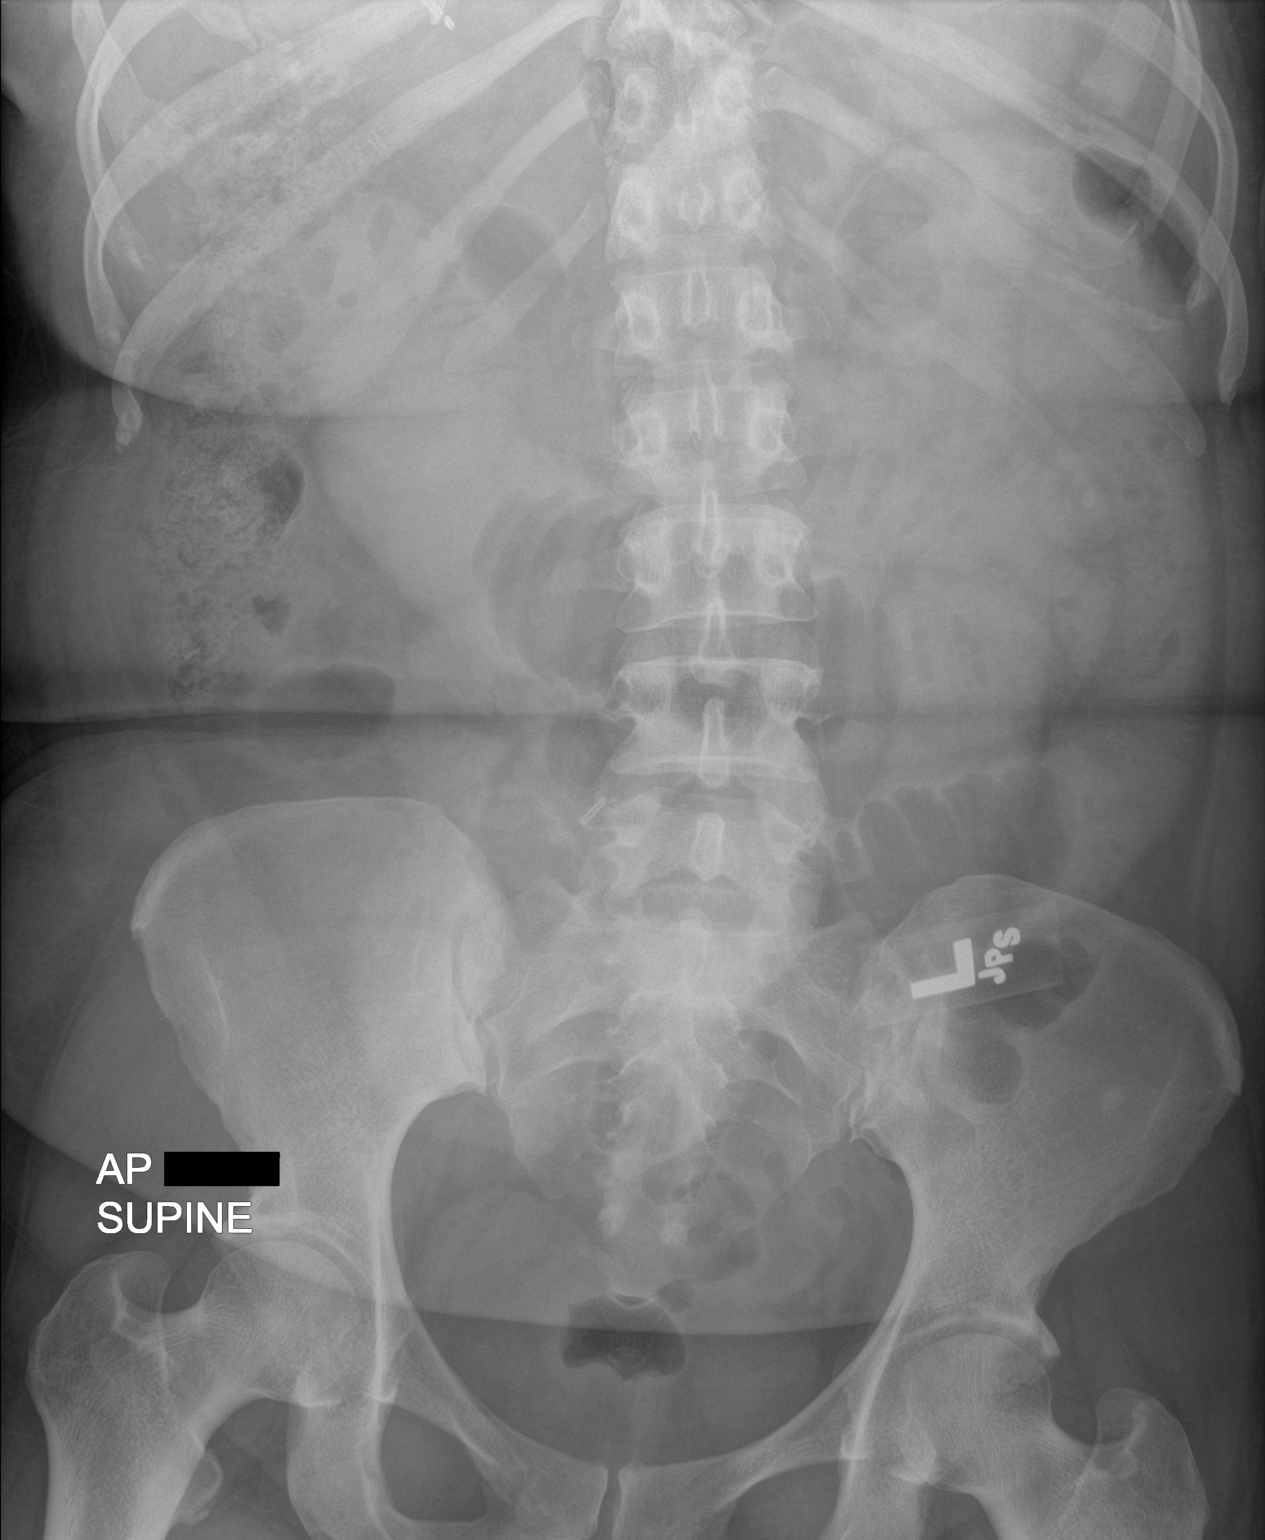

[1 of 1 positions shown; findings below may reference images not displayed]

FINDINGS: Persistent dilated small bowel in the central abdomen. Small bowel
distention up to 5.3 cm. There is no enteric contrast visualized in
the colon. Cholecystectomy clips in the right upper quadrant. No
evidence of free air.
IMPRESSION: Persistent small bowel dilatation consistent with obstruction. No
enteric contrast visualized in the colon.
